# Patient Record
Sex: Female | Born: 1975 | Race: White | Hispanic: No | Marital: Married | Smoking: Never smoker
Health system: Southern US, Community
[De-identification: ages and names within clinical notes are randomized; demographics above are authoritative.]

## PROBLEM LIST (undated history)

## (undated) DIAGNOSIS — E039 Hypothyroidism, unspecified: Secondary | ICD-10-CM

## (undated) DIAGNOSIS — R51 Headache: Secondary | ICD-10-CM

## (undated) DIAGNOSIS — F40240 Claustrophobia: Secondary | ICD-10-CM

## (undated) DIAGNOSIS — F419 Anxiety disorder, unspecified: Secondary | ICD-10-CM

## (undated) DIAGNOSIS — E282 Polycystic ovarian syndrome: Secondary | ICD-10-CM

## (undated) HISTORY — PX: TONSILLECTOMY: SUR1361

## (undated) HISTORY — PX: HYSTEROSCOPY: SHX211

---

## 2007-05-20 ENCOUNTER — Ambulatory Visit (HOSPITAL_COMMUNITY): Admission: RE | Admit: 2007-05-20 | Discharge: 2007-05-20 | Payer: Self-pay | Admitting: Obstetrics and Gynecology

## 2007-05-20 ENCOUNTER — Encounter (INDEPENDENT_AMBULATORY_CARE_PROVIDER_SITE_OTHER): Payer: Self-pay | Admitting: Obstetrics and Gynecology

## 2009-11-08 ENCOUNTER — Ambulatory Visit (HOSPITAL_COMMUNITY): Admission: RE | Admit: 2009-11-08 | Discharge: 2009-11-08 | Payer: Self-pay | Admitting: Obstetrics and Gynecology

## 2010-03-26 LAB — CBC
HCT: 42.5 % (ref 36.0–46.0)
Hemoglobin: 14.2 g/dL (ref 12.0–15.0)
MCH: 28.9 pg (ref 26.0–34.0)
MCHC: 33.5 g/dL (ref 30.0–36.0)
MCV: 86.4 fL (ref 78.0–100.0)
Platelets: 258 K/uL (ref 150–400)
RBC: 4.92 MIL/uL (ref 3.87–5.11)
RDW: 14.5 % (ref 11.5–15.5)
WBC: 9.3 K/uL (ref 4.0–10.5)

## 2010-03-26 LAB — HCG, SERUM, QUALITATIVE: Preg, Serum: NEGATIVE

## 2010-05-27 NOTE — Op Note (Signed)
NAME:  Valerie Boone, Valerie Boone                 ACCOUNT NO.:  [id number]   MEDICAL RECORD NO.:  [id number]          PATIENT TYPE:  AMB   LOCATION:  SDC                           FACILITY:  [facility]   PHYSICIAN:  Juluis Mire, M.D.   DATE OF BIRTH:  01-Jul-1975   DATE OF PROCEDURE:  05/20/2007  DATE OF DISCHARGE:                               OPERATIVE REPORT   PREOPERATIVE DIAGNOSIS:  Endometrial polyp.   POSTOPERATIVE DIAGNOSIS:  Endometrial polyp.   PROCEDURE:  Paracervical block.  Cervical dilatation.  Hysteroscopy with  resection of polyp and endometrial curettings.   SURGEON:  Juluis Mire, MD.   ANESTHESIA:  General along with paracervical block.   ESTIMATED BLOOD LOSS:  Minimal.   PACKS AND DRAINS:  None.   INTRAOPERATIVE BLOOD PLACED:  None.   COMPLICATIONS:  None.   Indications were dictated in history and physical.   PROCEDURE:  The patient was taken to OR, placed in supine position.  After satisfactory level of general anesthesia was obtained, the patient  was placed in dorsal lithotomy position using the Allen stirrups.  Perineum and vagina were prepped out with Betadine.  The patient was  then draped in sterile field.  A specks was placed in vaginal vault.  Cervix was grasped with a single-tooth tenaculum.  A paracervical blocks  instituted using 1% Nesacaine.  Uterus sounded to approximately 8 cm.  Cervix serially dilated to a size 31 Pratt dilator.  Operative  hysteroscope was then introduced into the intrauterine cavity.  Intrauterine cavity was distended using sorbitol.  Visualization  revealed a polyp near the left tubal opening on the anterior wall.  This  was resected in total and sent for pathology.  No other endometrial  abnormalities were appreciated.  I did not do any further biopsies in  view of the fact she is trying to conceive but went ahead and did  endometrial curettings.  These were sent for pathology.  Total deficit  was 75 mL.  The patient had no active  light bleeding.  There was no  signs of perforation or complications.  A single-  tooth tenaculum and speculum were then removed.  The patient taken out  of dorsal position.  Once alert and extubated, transferred to recovery  room in good condition.  Sponge, instrument, and needle count was  correct by circulating nurse.      Juluis Mire, M.D.  Electronically Signed     JSM/MEDQ  D:  05/20/2007  T:  05/20/2007  Job:  [id number]

## 2010-05-27 NOTE — H&P (Signed)
NAME:  Valerie Boone, Valerie Boone NO.:  [id number]   MEDICAL RECORD NO.:  [id number]          PATIENT TYPE:  AMB   LOCATION:  SDC                           [facility]ACILITY:  [facility]   PHYSICIAN:  Juluis Mire, M.D.   DATE OF BIRTH:  Apr 21, 1975   DATE OF ADMISSION:  DATE OF DISCHARGE:                              HISTORY & PHYSICAL   HISTORY OF PRESENT ILLNESS:  The patient is a 35 year old nulligravida  female who presents for hysteroscopy.   RELATION TO PRESENT ADMISSION:  The patient has a longstanding history  of oligomenorrhea secondary anovulation.  We did a saline infusion  ultrasound that revealed an anterior wall polyp.  She presents now for  hysteroscopic resection of the polyp.  The nature of the polyps and  nature of hysteroscopy have been discussed.  It is of note she is under  evaluation by Dr. 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   ALLERGIES:  SULFA AND AMPICILLIN.   MEDICATIONS:  None.   PAST MEDICAL HISTORY:  1. Usual childhood diseases.  2. No significant sequelae.  3. Only previous surgeries are tonsillectomy.   FAMILY HISTORY:  There is a history of cervical cancer in a sister and  diabetes in the family.   SOCIAL HISTORY:  Reveals no tobacco or alcohol use.   REVIEW OF SYSTEMS:  Noncontributory.   PHYSICAL EXAMINATION:  VITAL SIGNS:  The patient is afebrile, stable  vital signs.  HEENT: The patient is normocephalic.  Pupils equal, round, reactive to  light and accommodation.  Extraocular movements were intact.  Sclerae  and conjunctive are clear.  Oropharynx clear.  BREASTS:  No discrete masses.  LUNGS:  Clear.  CARDIOVASCULAR:  System regular rate murmurs or gallops.  ABDOMEN:  Benign.  No mass, megaly or tenderness.  PELVIC:  Normal external genitalia.  Vaginal mucosa clear.  Cervix  unremarkable.  Uterus normal size, shape and contour.  Adnexa free of  masses or tenderness.  EXTREMITIES:  Trace  edema.  NEUROLOGICAL:  Exam is grossly within normal limits.   IMPRESSION:  1. Anovulatory cycling  2. Endometrial polyp.   PLAN:  At the present time, the patient will undergo hysteroscopic  evaluation and resection of polyp.  The risk of procedure have been  discussed including the risk of infection.  Risk of hemorrhage could  require transfusion with the risk of AIDS or hepatitis.  Risk of injury  to adjacent organs through perforation that could require further  exploratory surgery.  Risk of deep venous thrombosis and pulmonary  embolus.  Certain complications could lead to hysterectomy.  Obviously,  leaving the patient infertile and sterile at that point in time.  The  patient versed understanding of indications and risks.      Juluis Mire, M.D.  Electronically Signed     JSM/MEDQ  D:  05/20/2007  T:  05/20/2007  Job:  [id number]

## 2010-07-28 ENCOUNTER — Other Ambulatory Visit: Payer: Self-pay | Admitting: Family Medicine

## 2010-07-28 ENCOUNTER — Other Ambulatory Visit (HOSPITAL_COMMUNITY)
Admission: RE | Admit: 2010-07-28 | Discharge: 2010-07-28 | Disposition: A | Discharge status: Home / Self Care | Payer: 59 | Source: Ambulatory Visit | Attending: Family Medicine | Admitting: Family Medicine

## 2010-07-28 DIAGNOSIS — Z124 Encounter for screening for malignant neoplasm of cervix: Secondary | ICD-10-CM | POA: Insufficient documentation

## 2010-07-28 DIAGNOSIS — Z1159 Encounter for screening for other viral diseases: Secondary | ICD-10-CM | POA: Insufficient documentation

## 2011-03-06 ENCOUNTER — Other Ambulatory Visit: Payer: Self-pay | Admitting: Family Medicine

## 2011-03-06 ENCOUNTER — Ambulatory Visit
Admission: RE | Admit: 2011-03-06 | Discharge: 2011-03-06 | Disposition: A | Discharge status: Home / Self Care | Payer: 59 | Source: Ambulatory Visit | Attending: Family Medicine | Admitting: Family Medicine

## 2011-03-06 DIAGNOSIS — M533 Sacrococcygeal disorders, not elsewhere classified: Secondary | ICD-10-CM

## 2011-05-19 ENCOUNTER — Ambulatory Visit: Payer: 59 | Attending: Family Medicine

## 2011-05-19 DIAGNOSIS — R5381 Other malaise: Secondary | ICD-10-CM | POA: Insufficient documentation

## 2011-05-19 DIAGNOSIS — M25559 Pain in unspecified hip: Secondary | ICD-10-CM | POA: Insufficient documentation

## 2011-05-19 DIAGNOSIS — IMO0001 Reserved for inherently not codable concepts without codable children: Secondary | ICD-10-CM | POA: Insufficient documentation

## 2011-05-28 ENCOUNTER — Encounter: Payer: 59 | Admitting: Physical Therapy

## 2011-06-02 ENCOUNTER — Ambulatory Visit: Payer: 59

## 2011-06-11 ENCOUNTER — Ambulatory Visit: Payer: 59 | Admitting: Physical Therapy

## 2011-06-18 ENCOUNTER — Other Ambulatory Visit (HOSPITAL_COMMUNITY)
Admission: RE | Admit: 2011-06-18 | Discharge: 2011-06-18 | Disposition: A | Discharge status: Home / Self Care | Payer: 59 | Source: Ambulatory Visit | Attending: Obstetrics and Gynecology | Admitting: Obstetrics and Gynecology

## 2011-06-18 ENCOUNTER — Other Ambulatory Visit: Payer: Self-pay | Admitting: Obstetrics and Gynecology

## 2011-06-18 DIAGNOSIS — Z1159 Encounter for screening for other viral diseases: Secondary | ICD-10-CM | POA: Insufficient documentation

## 2011-06-18 DIAGNOSIS — Z01419 Encounter for gynecological examination (general) (routine) without abnormal findings: Secondary | ICD-10-CM | POA: Insufficient documentation

## 2011-09-24 ENCOUNTER — Ambulatory Visit: Payer: 59 | Attending: Family Medicine | Admitting: Physical Therapy

## 2011-09-24 DIAGNOSIS — M2569 Stiffness of other specified joint, not elsewhere classified: Secondary | ICD-10-CM | POA: Insufficient documentation

## 2011-09-24 DIAGNOSIS — M542 Cervicalgia: Secondary | ICD-10-CM | POA: Insufficient documentation

## 2011-09-24 DIAGNOSIS — IMO0001 Reserved for inherently not codable concepts without codable children: Secondary | ICD-10-CM | POA: Insufficient documentation

## 2011-10-01 ENCOUNTER — Ambulatory Visit: Payer: 59

## 2011-10-08 ENCOUNTER — Ambulatory Visit: Payer: 59 | Admitting: Physical Therapy

## 2011-10-13 ENCOUNTER — Ambulatory Visit: Payer: 59 | Attending: Family Medicine | Admitting: Physical Therapy

## 2011-10-13 DIAGNOSIS — M542 Cervicalgia: Secondary | ICD-10-CM | POA: Insufficient documentation

## 2011-10-13 DIAGNOSIS — IMO0001 Reserved for inherently not codable concepts without codable children: Secondary | ICD-10-CM | POA: Insufficient documentation

## 2011-10-13 DIAGNOSIS — M2569 Stiffness of other specified joint, not elsewhere classified: Secondary | ICD-10-CM | POA: Insufficient documentation

## 2011-10-15 ENCOUNTER — Ambulatory Visit: Payer: 59 | Admitting: Physical Therapy

## 2011-10-20 ENCOUNTER — Ambulatory Visit: Payer: 59 | Admitting: Physical Therapy

## 2011-10-22 ENCOUNTER — Ambulatory Visit: Payer: 59 | Admitting: Physical Therapy

## 2011-11-12 ENCOUNTER — Ambulatory Visit: Payer: 59

## 2011-11-17 ENCOUNTER — Ambulatory Visit: Payer: 59 | Attending: Family Medicine | Admitting: Physical Therapy

## 2011-11-17 DIAGNOSIS — M542 Cervicalgia: Secondary | ICD-10-CM | POA: Insufficient documentation

## 2011-11-17 DIAGNOSIS — IMO0001 Reserved for inherently not codable concepts without codable children: Secondary | ICD-10-CM | POA: Insufficient documentation

## 2011-11-17 DIAGNOSIS — M2569 Stiffness of other specified joint, not elsewhere classified: Secondary | ICD-10-CM | POA: Insufficient documentation

## 2011-12-01 ENCOUNTER — Ambulatory Visit: Payer: 59 | Admitting: Physical Therapy

## 2011-12-25 ENCOUNTER — Other Ambulatory Visit: Payer: Self-pay | Admitting: Family Medicine

## 2011-12-25 DIAGNOSIS — M542 Cervicalgia: Secondary | ICD-10-CM

## 2011-12-31 ENCOUNTER — Ambulatory Visit
Admission: RE | Admit: 2011-12-31 | Discharge: 2011-12-31 | Disposition: A | Discharge status: Home / Self Care | Payer: 59 | Source: Ambulatory Visit | Attending: Family Medicine | Admitting: Family Medicine

## 2011-12-31 DIAGNOSIS — M542 Cervicalgia: Secondary | ICD-10-CM

## 2012-01-01 ENCOUNTER — Other Ambulatory Visit: Payer: 59

## 2012-01-20 ENCOUNTER — Other Ambulatory Visit: Payer: Self-pay | Admitting: Neurosurgery

## 2012-01-28 ENCOUNTER — Encounter (HOSPITAL_COMMUNITY)
Admission: RE | Admit: 2012-01-28 | Discharge: 2012-01-28 | Disposition: A | Discharge status: Home / Self Care | Payer: 59 | Source: Ambulatory Visit | Attending: Neurosurgery | Admitting: Neurosurgery

## 2012-01-28 ENCOUNTER — Encounter (HOSPITAL_COMMUNITY): Payer: Self-pay

## 2012-01-28 HISTORY — DX: Hypothyroidism, unspecified: E03.9

## 2012-01-28 HISTORY — DX: Anxiety disorder, unspecified: F41.9

## 2012-01-28 HISTORY — DX: Claustrophobia: F40.240

## 2012-01-28 HISTORY — DX: Polycystic ovarian syndrome: E28.2

## 2012-01-28 HISTORY — DX: Headache: R51

## 2012-01-28 LAB — SURGICAL PCR SCREEN: MRSA, PCR: NEGATIVE

## 2012-01-28 LAB — CBC WITH DIFFERENTIAL/PLATELET
Basophils Relative: 0 % (ref 0–1)
Eosinophils Absolute: 0.1 10*3/uL (ref 0.0–0.7)
MCH: 27.9 pg (ref 26.0–34.0)
MCHC: 33.2 g/dL (ref 30.0–36.0)
Neutrophils Relative %: 68 % (ref 43–77)
Platelets: 273 10*3/uL (ref 150–400)
RDW: 14.8 % (ref 11.5–15.5)

## 2012-01-28 LAB — HCG, SERUM, QUALITATIVE: Preg, Serum: NEGATIVE

## 2012-01-28 LAB — TYPE AND SCREEN
ABO/RH(D): A POS
Antibody Screen: NEGATIVE

## 2012-01-28 NOTE — Pre-Procedure Instructions (Signed)
Valerie Boone  01/28/2012   Your procedure is scheduled on:  Tuesday, January 21st  Report to [facility] at 0530 AM.  Call this number if you have problems the morning of surgery: [phone]   Remember:   Do not eat food or drink liquids after midnight.    Take these medicines the morning of surgery with A SIP OF WATER:    Do not wear jewelry, make-up or nail polish.  Do not wear lotions, powders, or perfumes. You may not wear deodorant.  Do not shave 48 hours prior to surgery. Men may shave face and neck.  Do not bring valuables to the hospital.  Contacts, dentures or bridgework may not be worn into surgery.  Leave suitcase in the car. After surgery it may be brought to your room.  For patients admitted to the hospital, checkout time is 11:00 AM the day of  discharge.   Patients discharged the day of surgery will not be allowed to drive  home.   Special Instructions: Shower using CHG 2 nights before surgery and the night before surgery.  If you shower the day of surgery use CHG.  Use special wash - you have one bottle of CHG for all showers.  You should use approximately 1/3 of the bottle for each shower.   Please read over the following fact sheets that you were given: Pain Booklet, Coughing and Deep Breathing, Blood Transfusion Information, MRSA Information and Surgical Site Infection Prevention

## 2012-01-28 NOTE — Progress Notes (Addendum)
Primary Physician - Dr. Carilyn Goodpasture, [facility] physicians  Does not have a cardiologist --- no previous cardiac testing.  Patient is on metformin for polycystic ovary disease not diabetes.

## 2012-02-02 ENCOUNTER — Ambulatory Visit (HOSPITAL_COMMUNITY): Payer: 59 | Admitting: Anesthesiology

## 2012-02-02 ENCOUNTER — Inpatient Hospital Stay (HOSPITAL_COMMUNITY)
Admission: RE | Admit: 2012-02-02 | Discharge: 2012-02-03 | DRG: 472 | Disposition: A | Discharge status: Home / Self Care | Payer: 59 | Source: Ambulatory Visit | Attending: Neurosurgery | Admitting: Neurosurgery

## 2012-02-02 ENCOUNTER — Ambulatory Visit (HOSPITAL_COMMUNITY): Payer: 59

## 2012-02-02 ENCOUNTER — Encounter (HOSPITAL_COMMUNITY)
Admission: RE | Disposition: A | Discharge status: Home / Self Care | Payer: Self-pay | Source: Ambulatory Visit | Attending: Neurosurgery

## 2012-02-02 ENCOUNTER — Encounter (HOSPITAL_COMMUNITY): Payer: Self-pay | Admitting: Anesthesiology

## 2012-02-02 ENCOUNTER — Encounter (HOSPITAL_COMMUNITY): Payer: Self-pay | Admitting: *Deleted

## 2012-02-02 ENCOUNTER — Encounter (HOSPITAL_COMMUNITY): Payer: Self-pay | Admitting: Neurosurgery

## 2012-02-02 DIAGNOSIS — Z79899 Other long term (current) drug therapy: Secondary | ICD-10-CM

## 2012-02-02 DIAGNOSIS — E039 Hypothyroidism, unspecified: Secondary | ICD-10-CM | POA: Diagnosis present

## 2012-02-02 DIAGNOSIS — Z9089 Acquired absence of other organs: Secondary | ICD-10-CM

## 2012-02-02 DIAGNOSIS — F40298 Other specified phobia: Secondary | ICD-10-CM | POA: Diagnosis present

## 2012-02-02 DIAGNOSIS — Z882 Allergy status to sulfonamides status: Secondary | ICD-10-CM

## 2012-02-02 DIAGNOSIS — Z881 Allergy status to other antibiotic agents status: Secondary | ICD-10-CM

## 2012-02-02 DIAGNOSIS — E282 Polycystic ovarian syndrome: Secondary | ICD-10-CM | POA: Diagnosis present

## 2012-02-02 DIAGNOSIS — M5000 Cervical disc disorder with myelopathy, unspecified cervical region: Principal | ICD-10-CM | POA: Diagnosis present

## 2012-02-02 DIAGNOSIS — E669 Obesity, unspecified: Secondary | ICD-10-CM | POA: Diagnosis present

## 2012-02-02 DIAGNOSIS — M5010 Cervical disc disorder with radiculopathy, unspecified cervical region: Secondary | ICD-10-CM | POA: Diagnosis present

## 2012-02-02 DIAGNOSIS — M4712 Other spondylosis with myelopathy, cervical region: Secondary | ICD-10-CM | POA: Diagnosis present

## 2012-02-02 DIAGNOSIS — F411 Generalized anxiety disorder: Secondary | ICD-10-CM | POA: Diagnosis present

## 2012-02-02 HISTORY — PX: ANTERIOR CERVICAL DECOMP/DISCECTOMY FUSION: SHX1161

## 2012-02-02 LAB — CREATININE, SERUM: GFR calc Af Amer: >90 mL/min (ref >90)

## 2012-02-02 SURGERY — ANTERIOR CERVICAL DECOMPRESSION/DISCECTOMY FUSION 2 LEVELS
Anesthesia: General | Site: Neck | Wound class: Clean

## 2012-02-02 MED ORDER — MENTHOL 3 MG MT LOZG: 1.0000 | LOZENGE | OROMUCOSAL | Status: DC | PRN

## 2012-02-02 MED ORDER — SODIUM CHLORIDE 0.9 % IV SOLN
INTRAVENOUS | Status: AC
  Filled 2012-02-02: qty 500

## 2012-02-02 MED ORDER — ZOLPIDEM TARTRATE 5 MG PO TABS: 5.0000 mg | ORAL_TABLET | Freq: Every evening | ORAL | Status: DC | PRN

## 2012-02-02 MED ORDER — DEXAMETHASONE SODIUM PHOSPHATE 10 MG/ML IJ SOLN: 10.0000 mg | INTRAMUSCULAR | Status: DC

## 2012-02-02 MED ORDER — HYDROCODONE-ACETAMINOPHEN 5-325 MG PO TABS: 1.0000 | ORAL_TABLET | ORAL | Status: DC | PRN

## 2012-02-02 MED ORDER — THROMBIN 5000 UNITS EX KIT
PACK | CUTANEOUS | Status: DC | PRN
  Administered 2012-02-02 (×2): 5000 [IU] via TOPICAL

## 2012-02-02 MED ORDER — GLYCOPYRROLATE 0.2 MG/ML IJ SOLN
INTRAMUSCULAR | Status: DC | PRN
  Administered 2012-02-02: 0.4 mg via INTRAVENOUS

## 2012-02-02 MED ORDER — SENNA 8.6 MG PO TABS
1.0000 | ORAL_TABLET | Freq: Two times a day (BID) | ORAL | Status: DC
  Administered 2012-02-02: 8.6 mg via ORAL
  Filled 2012-02-02 (×3): qty 1

## 2012-02-02 MED ORDER — BACITRACIN 50000 UNITS IM SOLR
INTRAMUSCULAR | Status: AC
  Filled 2012-02-02: qty 1

## 2012-02-02 MED ORDER — LIDOCAINE HCL (CARDIAC) 20 MG/ML IV SOLN
INTRAVENOUS | Status: DC | PRN
  Administered 2012-02-02: 40 mg via INTRAVENOUS

## 2012-02-02 MED ORDER — ACETAMINOPHEN 650 MG RE SUPP: 650.0000 mg | RECTAL | Status: DC | PRN

## 2012-02-02 MED ORDER — ONDANSETRON HCL 4 MG/2ML IJ SOLN: 4.0000 mg | INTRAMUSCULAR | Status: DC | PRN

## 2012-02-02 MED ORDER — LACTATED RINGERS IV SOLN
INTRAVENOUS | Status: DC | PRN
  Administered 2012-02-02: 07:00:00 via INTRAVENOUS

## 2012-02-02 MED ORDER — HYDROMORPHONE HCL PF 1 MG/ML IJ SOLN
0.2500 mg | INTRAMUSCULAR | Status: DC | PRN
  Administered 2012-02-02 (×2): 0.5 mg via INTRAVENOUS

## 2012-02-02 MED ORDER — CYCLOBENZAPRINE HCL 10 MG PO TABS
10.0000 mg | ORAL_TABLET | Freq: Three times a day (TID) | ORAL | Status: DC | PRN
  Administered 2012-02-02 – 2012-02-03 (×2): 10 mg via ORAL
  Filled 2012-02-02 (×2): qty 1

## 2012-02-02 MED ORDER — ALUM & MAG HYDROXIDE-SIMETH 200-200-20 MG/5ML PO SUSP: 30.0000 mL | Freq: Four times a day (QID) | ORAL | Status: DC | PRN

## 2012-02-02 MED ORDER — HEMOSTATIC AGENTS (NO CHARGE) OPTIME
TOPICAL | Status: DC | PRN
  Administered 2012-02-02: 1 via TOPICAL

## 2012-02-02 MED ORDER — SODIUM CHLORIDE 0.9 % IR SOLN
Status: DC | PRN
  Administered 2012-02-02: 09:00:00

## 2012-02-02 MED ORDER — LEVOTHYROXINE SODIUM 25 MCG PO TABS
25.0000 ug | ORAL_TABLET | Freq: Every day | ORAL | Status: DC
  Administered 2012-02-03: 25 ug via ORAL
  Filled 2012-02-02 (×2): qty 1

## 2012-02-02 MED ORDER — HYDROMORPHONE HCL PF 1 MG/ML IJ SOLN: 0.5000 mg | INTRAMUSCULAR | Status: DC | PRN

## 2012-02-02 MED ORDER — ONDANSETRON HCL 4 MG/2ML IJ SOLN
INTRAMUSCULAR | Status: DC | PRN
  Administered 2012-02-02: 4 mg via INTRAVENOUS

## 2012-02-02 MED ORDER — FLEET ENEMA 7-19 GM/118ML RE ENEM: 1.0000 | ENEMA | Freq: Once | RECTAL | Status: AC | PRN

## 2012-02-02 MED ORDER — PROPOFOL 10 MG/ML IV BOLUS
INTRAVENOUS | Status: DC | PRN
  Administered 2012-02-02: 160 mg via INTRAVENOUS

## 2012-02-02 MED ORDER — PHENOL 1.4 % MT LIQD: 1.0000 | OROMUCOSAL | Status: DC | PRN

## 2012-02-02 MED ORDER — NEOSTIGMINE METHYLSULFATE 1 MG/ML IJ SOLN
INTRAMUSCULAR | Status: DC | PRN
  Administered 2012-02-02: 3 mg via INTRAVENOUS

## 2012-02-02 MED ORDER — THROMBIN 5000 UNITS EX SOLR
OROMUCOSAL | Status: DC | PRN
  Administered 2012-02-02: 09:00:00 via TOPICAL

## 2012-02-02 MED ORDER — METFORMIN HCL 500 MG PO TABS
500.0000 mg | ORAL_TABLET | Freq: Three times a day (TID) | ORAL | Status: DC
  Administered 2012-02-02 – 2012-02-03 (×3): 500 mg via ORAL
  Filled 2012-02-02 (×6): qty 1

## 2012-02-02 MED ORDER — BISACODYL 10 MG RE SUPP: 10.0000 mg | Freq: Every day | RECTAL | Status: DC | PRN

## 2012-02-02 MED ORDER — SODIUM CHLORIDE 0.9 % IJ SOLN: 3.0000 mL | INTRAMUSCULAR | Status: DC | PRN

## 2012-02-02 MED ORDER — ADULT MULTIVITAMIN W/MINERALS CH
1.0000 | ORAL_TABLET | Freq: Every day | ORAL | Status: DC
  Administered 2012-02-02: 1 via ORAL
  Filled 2012-02-02 (×2): qty 1

## 2012-02-02 MED ORDER — SODIUM CHLORIDE 0.9 % IJ SOLN
3.0000 mL | Freq: Two times a day (BID) | INTRAMUSCULAR | Status: DC
  Administered 2012-02-02 (×2): 3 mL via INTRAVENOUS

## 2012-02-02 MED ORDER — MIDAZOLAM HCL 5 MG/5ML IJ SOLN
INTRAMUSCULAR | Status: DC | PRN
  Administered 2012-02-02: 2 mg via INTRAVENOUS

## 2012-02-02 MED ORDER — ONDANSETRON HCL 4 MG/2ML IJ SOLN: 4.0000 mg | Freq: Once | INTRAMUSCULAR | Status: DC | PRN

## 2012-02-02 MED ORDER — SODIUM CHLORIDE 0.9 % IV SOLN
1500.0000 mg | Freq: Once | INTRAVENOUS | Status: AC
  Administered 2012-02-02: 1500 mg via INTRAVENOUS
  Filled 2012-02-02: qty 1500

## 2012-02-02 MED ORDER — FENTANYL CITRATE 0.05 MG/ML IJ SOLN
INTRAMUSCULAR | Status: DC | PRN
Start: 2012-02-02 — End: 2012-02-02
  Administered 2012-02-02: 50 ug via INTRAVENOUS
  Administered 2012-02-02: 150 ug via INTRAVENOUS
  Administered 2012-02-02: 50 ug via INTRAVENOUS
  Administered 2012-02-02: 100 ug via INTRAVENOUS
  Administered 2012-02-02: 50 ug via INTRAVENOUS

## 2012-02-02 MED ORDER — ACETAMINOPHEN 325 MG PO TABS: 650.0000 mg | ORAL_TABLET | ORAL | Status: DC | PRN

## 2012-02-02 MED ORDER — DEXAMETHASONE SODIUM PHOSPHATE 4 MG/ML IJ SOLN
INTRAMUSCULAR | Status: DC | PRN
  Administered 2012-02-02: 12 mg via INTRAVENOUS

## 2012-02-02 MED ORDER — HYDROMORPHONE HCL PF 1 MG/ML IJ SOLN
INTRAMUSCULAR | Status: AC
  Filled 2012-02-02: qty 1

## 2012-02-02 MED ORDER — 0.9 % SODIUM CHLORIDE (POUR BTL) OPTIME
TOPICAL | Status: DC | PRN
  Administered 2012-02-02: 1000 mL

## 2012-02-02 MED ORDER — ROCURONIUM BROMIDE 100 MG/10ML IV SOLN
INTRAVENOUS | Status: DC | PRN
  Administered 2012-02-02: 50 mg via INTRAVENOUS

## 2012-02-02 MED ORDER — OXYCODONE-ACETAMINOPHEN 5-325 MG PO TABS
1.0000 | ORAL_TABLET | ORAL | Status: DC | PRN
  Administered 2012-02-02 – 2012-02-03 (×4): 2 via ORAL
  Filled 2012-02-02 (×4): qty 2

## 2012-02-02 MED ORDER — ACETAMINOPHEN 10 MG/ML IV SOLN: 1000.0000 mg | Freq: Once | INTRAVENOUS | Status: DC | PRN

## 2012-02-02 MED ORDER — VANCOMYCIN HCL 10 G IV SOLR
1500.0000 mg | Freq: Two times a day (BID) | INTRAVENOUS | Status: DC
  Administered 2012-02-02 – 2012-02-03 (×2): 1500 mg via INTRAVENOUS
  Filled 2012-02-02 (×3): qty 1500

## 2012-02-02 MED ORDER — ACETAMINOPHEN 10 MG/ML IV SOLN
INTRAVENOUS | Status: AC
  Administered 2012-02-02: 1000 mg via INTRAVENOUS
  Filled 2012-02-02: qty 100

## 2012-02-02 MED ORDER — CYCLOBENZAPRINE HCL 10 MG PO TABS: 10.0000 mg | ORAL_TABLET | Freq: Three times a day (TID) | ORAL | Status: DC | PRN

## 2012-02-02 MED ORDER — POLYETHYLENE GLYCOL 3350 17 G PO PACK
17.0000 g | PACK | Freq: Every day | ORAL | Status: DC | PRN
  Filled 2012-02-02: qty 1

## 2012-02-02 SURGICAL SUPPLY — 56 items
BAG DECANTER FOR FLEXI CONT (MISCELLANEOUS) ×2 IMPLANT
BENZOIN TINCTURE PRP APPL 2/3 (GAUZE/BANDAGES/DRESSINGS) ×2 IMPLANT
BRUSH SCRUB EZ PLAIN DRY (MISCELLANEOUS) ×2 IMPLANT
BUR MATCHSTICK NEURO 3.0 LAGG (BURR) ×2 IMPLANT
CANISTER SUCTION 2500CC (MISCELLANEOUS) ×2 IMPLANT
CLOTH BEACON ORANGE TIMEOUT ST (SAFETY) ×2 IMPLANT
CONT SPEC 4OZ CLIKSEAL STRL BL (MISCELLANEOUS) ×2 IMPLANT
DRAPE C-ARM 42X72 X-RAY (DRAPES) ×4 IMPLANT
DRAPE LAPAROTOMY 100X72 PEDS (DRAPES) ×2 IMPLANT
DRAPE MICROSCOPE LEICA (MISCELLANEOUS) ×2 IMPLANT
DRAPE MICROSCOPE ZEISS OPMI (DRAPES) IMPLANT
DRAPE POUCH INSTRU U-SHP 10X18 (DRAPES) ×2 IMPLANT
DRILL BIT (BIT) ×2 IMPLANT
ELECT COATED BLADE 2.86 ST (ELECTRODE) ×2 IMPLANT
ELECT REM PT RETURN 9FT ADLT (ELECTROSURGICAL) ×2
ELECTRODE REM PT RTRN 9FT ADLT (ELECTROSURGICAL) ×1 IMPLANT
EVACUATOR 1/8 PVC DRAIN (DRAIN) ×2 IMPLANT
GAUZE SPONGE 4X4 16PLY XRAY LF (GAUZE/BANDAGES/DRESSINGS) IMPLANT
GLOVE BIOGEL M 8.0 STRL (GLOVE) ×2 IMPLANT
GLOVE BIOGEL PI IND STRL 6.5 (GLOVE) ×3 IMPLANT
GLOVE BIOGEL PI INDICATOR 6.5 (GLOVE) ×3
GLOVE ECLIPSE 8.5 STRL (GLOVE) ×2 IMPLANT
GLOVE EXAM NITRILE LRG STRL (GLOVE) IMPLANT
GLOVE EXAM NITRILE MD LF STRL (GLOVE) IMPLANT
GLOVE EXAM NITRILE XL STR (GLOVE) IMPLANT
GLOVE EXAM NITRILE XS STR PU (GLOVE) IMPLANT
GLOVE INDICATOR 7.0 STRL GRN (GLOVE) ×4 IMPLANT
GOWN BRE IMP SLV AUR LG STRL (GOWN DISPOSABLE) ×2 IMPLANT
GOWN BRE IMP SLV AUR XL STRL (GOWN DISPOSABLE) ×4 IMPLANT
GOWN STRL REIN 2XL LVL4 (GOWN DISPOSABLE) IMPLANT
HEAD HALTER (SOFTGOODS) ×2 IMPLANT
HEMOSTAT POWDER SURGIFOAM 1G (HEMOSTASIS) ×2 IMPLANT
HEMOSTAT SURGICEL 2X14 (HEMOSTASIS) IMPLANT
KIT BASIN OR (CUSTOM PROCEDURE TRAY) ×2 IMPLANT
KIT ROOM TURNOVER OR (KITS) ×2 IMPLANT
NEEDLE SPNL 20GX3.5 QUINCKE YW (NEEDLE) ×2 IMPLANT
NS IRRIG 1000ML POUR BTL (IV SOLUTION) ×2 IMPLANT
PACK LAMINECTOMY NEURO (CUSTOM PROCEDURE TRAY) ×2 IMPLANT
PAD ARMBOARD 7.5X6 YLW CONV (MISCELLANEOUS) ×6 IMPLANT
PLATE VISION ELITE 40MM (Plate) ×2 IMPLANT
RUBBERBAND STERILE (MISCELLANEOUS) ×4 IMPLANT
SCREW ST 13X4XST VA NS SPNE (Screw) ×6 IMPLANT
SCREW ST VAR 4 ATL (Screw) ×6 IMPLANT
SPACER BONE CORNERSTONE 6X14 (Orthopedic Implant) ×4 IMPLANT
SPONGE GAUZE 4X4 12PLY (GAUZE/BANDAGES/DRESSINGS) IMPLANT
SPONGE INTESTINAL PEANUT (DISPOSABLE) ×2 IMPLANT
SPONGE SURGIFOAM ABS GEL SZ50 (HEMOSTASIS) ×2 IMPLANT
STRIP CLOSURE SKIN 1/2X4 (GAUZE/BANDAGES/DRESSINGS) ×2 IMPLANT
SUT PDS AB 5-0 P3 18 (SUTURE) ×2 IMPLANT
SUT VIC AB 3-0 SH 8-18 (SUTURE) ×2 IMPLANT
SYR 20ML ECCENTRIC (SYRINGE) ×2 IMPLANT
TAPE CLOTH 4X10 WHT NS (GAUZE/BANDAGES/DRESSINGS) ×2 IMPLANT
TAPE CLOTH SURG 4X10 WHT LF (GAUZE/BANDAGES/DRESSINGS) ×2 IMPLANT
TOWEL OR 17X24 6PK STRL BLUE (TOWEL DISPOSABLE) ×2 IMPLANT
TOWEL OR 17X26 10 PK STRL BLUE (TOWEL DISPOSABLE) ×2 IMPLANT
WATER STERILE IRR 1000ML POUR (IV SOLUTION) ×2 IMPLANT

## 2012-02-02 NOTE — Progress Notes (Signed)
Called Dr. Dutch Quint regarding allergy to ampicillin. No answer.

## 2012-02-02 NOTE — Transfer of Care (Signed)
Immediate Anesthesia Transfer of Care Note  Patient: Valerie Boone  Procedure(s) Performed: Procedure(s) (LRB) with comments: ANTERIOR CERVICAL DECOMPRESSION/DISCECTOMY FUSION 2 LEVELS (N/A) - Cerivcal five-six, cervical six-seven anterior cervical decompression fusion with allograft and plating  Patient Location: PACU  Anesthesia Type:General  Level of Consciousness: awake, alert , oriented and patient cooperative  Airway & Oxygen Therapy: Patient Spontanous Breathing and Patient connected to nasal cannula oxygen  Post-op Assessment: Report given to PACU RN, Post -op Vital signs reviewed and stable and Patient moving all extremities  Post vital signs: Reviewed and stable  Complications: No apparent anesthesia complications

## 2012-02-02 NOTE — Plan of Care (Signed)
Problem: Consults Goal: Diagnosis - Spinal Surgery Outcome: Completed/Met Date Met:  02/02/12 Cervical Spine Fusion

## 2012-02-02 NOTE — H&P (Signed)
Valerie Boone is an 37 y.o. female.   Chief Complaint: Neck and left arm pain HPI: 37 year old female with neck and left upper extremity pain failing conservative management and presents with signs and symptoms of a C6 and C7 radiculopathy. Patient has undergone MRI testing which demonstrates thickening spondylosis with nerve root compression at C5-6 and C6-7. Patient presents now for 2 level anterior cervical decompression and fusion.  Past Medical History  Diagnosis Date  . Hypothyroidism   . Anxiety   . Claustrophobia   . Polycystic ovary disease   . Headache     tension - 3 to 4 times per week    Past Surgical History  Procedure Date  . Tonsillectomy   . Hysteroscopy     History reviewed. No pertinent family history. Social History:  reports that she has never smoked. She does not have any smokeless tobacco history on file. She reports that she drinks alcohol. She reports that she does not use illicit drugs.  Allergies:  Allergies  Allergen Reactions  . Ampicillin Hives    High fevers  . Sulfa Antibiotics Hives    High fever    Medications Prior to Admission  Medication Sig Dispense Refill  . levothyroxine (SYNTHROID, LEVOTHROID) 25 MCG tablet Take 25 mcg by mouth daily.      . metFORMIN (GLUCOPHAGE) 500 MG tablet Take 500 mg by mouth 3 (three) times daily with meals.      . Multiple Vitamin (MULTIVITAMIN WITH MINERALS) TABS Take 1 tablet by mouth daily.        No results found for this or any previous visit (from the past 48 hour(s)). No results found.  Review of Systems  Constitutional: Negative.   HENT: Negative.   Eyes: Negative.   Respiratory: Negative.   Cardiovascular: Negative.   Gastrointestinal: Negative.   Genitourinary: Negative.   Musculoskeletal: Negative.   Skin: Negative.   Neurological: Negative.   Endo/Heme/Allergies: Negative.   Psychiatric/Behavioral: Negative.     Blood pressure 137/90, pulse 70, temperature 97.7 F (36.5 C),  temperature source Oral, resp. rate 18, SpO2 96.00%. Physical Exam  Constitutional: She is oriented to person, place, and time. She appears well-developed and well-nourished. No distress.  HENT:  Head: Normocephalic and atraumatic.  Right Ear: External ear normal.  Left Ear: External ear normal.  Nose: Nose normal.  Mouth/Throat: Oropharynx is clear and moist.  Eyes: Conjunctivae normal and EOM are normal. Pupils are equal, round, and reactive to light. Right eye exhibits no discharge. Left eye exhibits no discharge.  Neck: Normal range of motion. Neck supple. No tracheal deviation present. No thyromegaly present.  Cardiovascular: Normal rate, regular rhythm, normal heart sounds and intact distal pulses.  Exam reveals no friction rub.   No murmur heard. Respiratory: Effort normal and breath sounds normal. No respiratory distress. She has no wheezes.  GI: Soft. Bowel sounds are normal. She exhibits no distension. There is no tenderness.  Musculoskeletal: Normal range of motion. She exhibits no edema and no tenderness.  Neurological: She is alert and oriented to person, place, and time. She has normal reflexes. No cranial nerve deficit. Coordination normal.  Skin: Skin is warm and dry. No rash noted. She is not diaphoretic. No erythema. No pallor.  Psychiatric: She has a normal mood and affect. Her behavior is normal. Judgment and thought content normal.     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 Plan C5-6 and C6-7 anterior cervical setting fusion with allograft and her plating. Risks and  benefits been explained. Patient wishes to proceed.  Corie Vavra A 02/02/2012, 7:46 AM

## 2012-02-02 NOTE — Op Note (Signed)
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  Postoperative diagnosis: Same  Procedure Name: C5-6, C6-7 anterior cervical discectomy and fusion with allograft and anterior plating.  Surgeon:Jannatul Wojdyla A.Laquincy Eastridge, M.D.  Asst. Surgeon: Jeral Fruit  Anesthesia: General  Indication: 37 year old female with neck and left upper trimming pain failing conservative management her workup demonstrates evidence of central disc herniation at C5-6 with spinal cord compression and spondylosis off the left-sided C6-7 with neural foraminal stenosis. Patient's failed conservative management and presents now for 2 level anterior cervical decompression infusion in hopes of improving her symptoms.  Operative note: After induction anesthesia, patient positioned supine with her neck slightly extended. Cervical region prepped and draped. Incision made over the C6 level. Dissection proceeded through the platysma and along the medial border of the sternocleidomastoid muscle and carotid sheath. Trachea and esophagus were mobilized and retracted towards the left. Prevertebral fascia stripped off the anterior spinal column. Longus colli muscles elevated bilaterally. Self-retaining retractors placed intraoperative fluoroscopy used levels confirmed. The spaces at C5-6 and C6-7 incised with 15 blade in a rectangular fashion. Y. disc space cleanout was achieved using pituitary rongeurs forward and angled and backward angle Carlen curettes Kerrison rongeurs the high-speed drill. All elements the disc removed down to level posterior annulus. Microscope was then brought into the field and used throughout the remainder of the discectomy. Remaining aspects of annulus and osteophytes removed using high-speed drill down to level posterior lateral. Posterior longitudinal limb was elevated and  resected piecemeal fashion using Kerrison rongeurs. Underlying thecal sac was identified. Wide central decompression and perform undercutting the bodies of C5 and C6. Decompression MCH are of foramen. Wide anterior foraminotomies were then performed on course exiting C6 nerve roots bilaterally. At this point a very thorough decompression had been achieved. There is no his injury to thecal sac and nerve roots. Procedure then repeated at C6-7 again without complication. Wound is then irrigated out solution. Gelfoam was placed topically for hemostasis as needed. Cornerstone allograft wedges and packed in place recessed roughly 1 mm from the anterior cortical margin. Atlantis anterior cervical plate was then placed over the C5-6 and 7 levels. This is an attachment under fluoroscopic guidance. 13 mm variable screws were placed at all 3 levels. All screws given a final tightening be solidly within bone. Locking screws were engaged. Final images were limited secondary to patient's body habitus but appear to show good position of plate at the proper upper level with normal lamina spine. Wound is then irrigated one final time. Medium Hemovac drain was left in the prevertebral space. Wounds and close in layers with Vicryl sutures. Steri-Strips and a sterile dressing were applied. There were no apparent complications. Patient tolerated the procedure well and returned to the recovery postop.

## 2012-02-02 NOTE — Anesthesia Preprocedure Evaluation (Addendum)
Anesthesia Evaluation  Patient identified by MRN, date of birth, ID band Patient awake    Reviewed: Allergy & Precautions, H&P , Patient's Chart, lab work & pertinent test results  History of Anesthesia Complications Negative for: history of anesthetic complications  Airway Mallampati: I TM Distance: >3 FB Neck ROM: Full    Dental  (+) Teeth Intact and Dental Advisory Given   Pulmonary  breath sounds clear to auscultation        Cardiovascular negative cardio ROS  Rhythm:Regular Rate:Normal     Neuro/Psych  Headaches, Anxiety    GI/Hepatic   Endo/Other  Hypothyroidism   Renal/GU   Female GU complaint Polycystic ovaries     Musculoskeletal   Abdominal   Peds  Hematology   Anesthesia Other Findings Obese   Reproductive/Obstetrics                         Anesthesia Physical Anesthesia Plan  ASA: III  Anesthesia Plan: General   Post-op Pain Management:    Induction: Intravenous  Airway Management Planned: Oral ETT  Additional Equipment:   Intra-op Plan:   Post-operative Plan: Extubation in OR  Informed Consent: I have reviewed the patients History and Physical, chart, labs and discussed the procedure including the risks, benefits and alternatives for the proposed anesthesia with the patient or authorized representative who has indicated his/her understanding and acceptance.   Dental advisory given  Plan Discussed with: CRNA and Surgeon  Anesthesia Plan Comments: (HNP C5-6 C6-7 Obesity On metformin for polycystic kidney disease denies DM  Plan GA with oral ETT )        Anesthesia Quick Evaluation

## 2012-02-02 NOTE — Brief Op Note (Signed)
02/02/2012  10:00 AM  PATIENT:  Valerie Boone  37 y.o. female  PRE-OPERATIVE DIAGNOSIS:  Herniated Nucleus Pulposus/Degenerative Disc Disease  POST-OPERATIVE DIAGNOSIS:  Herniated Nucleus Pulposus/Degenerative Disc Disease  PROCEDURE:  Procedure(s) (LRB) with comments: ANTERIOR CERVICAL DECOMPRESSION/DISCECTOMY FUSION 2 LEVELS (N/A) - Cerivcal five-six, cervical six-seven anterior cervical decompression fusion with allograft and plating  SURGEON:  Surgeon(s) and Role:    * Temple Pacini, MD - Primary    * Karn Cassis, MD - Assisting  PHYSICIAN ASSISTANT:   ASSISTANTS:    ANESTHESIA:     EBL:  Total I/O In: 700 [I.V.:700] Out: 250 [Blood:250]  BLOOD ADMINISTERED:none  DRAINS: (Medium) Hemovact drain(s) in the Prevertebral space with  Suction Open   LOCAL MEDICATIONS USED:  NONE  SPECIMEN:  No Specimen  DISPOSITION OF SPECIMEN:  N/A  COUNTS:  YES  TOURNIQUET:  * No tourniquets in log *  DICTATION: .Dragon Dictation  PLAN OF CARE: Admit to inpatient   PATIENT DISPOSITION:  PACU - hemodynamically stable.   Delay start of Pharmacological VTE agent (>24hrs) due to surgical blood loss or risk of bleeding: yes

## 2012-02-02 NOTE — Discharge Summary (Signed)
Physician Discharge Summary  Patient ID: Valerie Boone MRN: [id number] DOB/AGE: November 07, 1975 36 y.o.  Admit date: 02/02/2012 Discharge date: 02/02/2012  Admission Diagnoses:  Discharge Diagnoses:  Principal Problem:  *Herniation of cervical intervertebral disc with radiculopathy   Discharged Condition: good  Hospital Course: Patient admitted to the hospital where she underwent uncomplicated 2 level anterior cervical decompression infusion. Postoperative she is done well. Neck and upper term a pain improved. Strength cessation intact. Ready for discharge home  Consults:   Significant Diagnostic Studies:   Treatments:   Discharge Exam: Blood pressure 130/79, pulse 104, temperature 98.4 F (36.9 C), temperature source Oral, resp. rate 20, SpO2 94.00%. Awake and alert oriented and appropriate. Cranial nerve function is intact. Motor sensory function extremities normal. Wound clean dry and intact next soft chest and abdomen benign.  Disposition: Final discharge disposition not confirmed     Medication List     As of 02/02/2012  6:20 PM    TAKE these medications         cyclobenzaprine 10 MG tablet   Commonly known as: FLEXERIL   Take 1 tablet (10 mg total) by mouth 3 (three) times daily as needed for muscle spasms.      HYDROcodone-acetaminophen 5-325 MG per tablet   Commonly known as: NORCO/VICODIN   Take 1-2 tablets by mouth every 4 (four) hours as needed.      levothyroxine 25 MCG tablet   Commonly known as: SYNTHROID, LEVOTHROID   Take 25 mcg by mouth daily.      metFORMIN 500 MG tablet   Commonly known as: GLUCOPHAGE   Take 500 mg by mouth 3 (three) times daily with meals.      multivitamin with minerals Tabs   Take 1 tablet by mouth daily.           Follow-up Information    Follow up with Nasier Thumm A, MD. Call in 1 week. (Ask for Lurena Joiner)    Contact information:   [address] [place] [address] [phone]           Signed: Temple Pacini 02/02/2012, 6:20 PM

## 2012-02-02 NOTE — Anesthesia Postprocedure Evaluation (Signed)
  Anesthesia Post-op Note  Patient: Valerie Boone  Procedure(s) Performed: Procedure(s) (LRB) with comments: ANTERIOR CERVICAL DECOMPRESSION/DISCECTOMY FUSION 2 LEVELS (N/A) - Cerivcal five-six, cervical six-seven anterior cervical decompression fusion with allograft and plating  Patient Location: PACU  Anesthesia Type:General  Level of Consciousness: awake, alert  and oriented  Airway and Oxygen Therapy: Patient Spontanous Breathing and Patient connected to nasal cannula oxygen  Post-op Pain: mild  Post-op Assessment: Post-op Vital signs reviewed, Patient's Cardiovascular Status Stable, Respiratory Function Stable, Patent Airway, No signs of Nausea or vomiting and Pain level controlled  Post-op Vital Signs: stable  Complications: No apparent anesthesia complications

## 2012-02-02 NOTE — Progress Notes (Addendum)
ANTIBIOTIC CONSULT NOTE - INITIAL  Pharmacy Consult for vancomycin Indication: post op prophylaxis s/p cervical disectomy and fusion.  Patient has a drain.  Allergies  Allergen Reactions  . Ampicillin Hives    High fevers  . Sulfa Antibiotics Hives    High fever    Patient Measurements:   Adjusted Body Weight:   Vital Signs: Temp: 98.9 F (37.2 C) (01/21 1123) Temp src: Oral (01/21 0620) BP: 124/85 mmHg (01/21 1123) Pulse Rate: 84  (01/21 1123) Intake/Output from previous day:   Intake/Output from this shift: Total I/O In: 1190 [P.O.:240; I.V.:950] Out: 250 [Blood:250]  Labs: No results found for this basename: WBC:3,HGB:3,PLT:3,LABCREA:3,CREATININE:3 in the last 72 hours CrCl is unknown because no creatinine reading has been taken and the patient has no height on file. No results found for this basename: VANCOTROUGH:2,VANCOPEAK:2,VANCORANDOM:2,GENTTROUGH:2,GENTPEAK:2,GENTRANDOM:2,TOBRATROUGH:2,TOBRAPEAK:2,TOBRARND:2,AMIKACINPEAK:2,AMIKACINTROU:2,AMIKACIN:2, in the last 72 hours   Microbiology: Recent Results (from the past 720 hour(s))  SURGICAL PCR SCREEN     Status: Normal   Collection Time   01/28/12  8:24 AM      Component Value Range Status Comment   MRSA, PCR NEGATIVE  NEGATIVE Final    Staphylococcus aureus NEGATIVE  NEGATIVE Final     Medical History: Past Medical History  Diagnosis Date  . Hypothyroidism   . Anxiety   . Claustrophobia   . Polycystic ovary disease   . Headache     tension - 3 to 4 times per week    Medications:  Scheduled:    . [COMPLETED] acetaminophen      . bacitracin      . HYDROmorphone      . levothyroxine  25 mcg Oral Daily  . metFORMIN  500 mg Oral TID WC  . multivitamin with minerals  1 tablet Oral Daily  . senna  1 tablet Oral BID  . sodium chloride      . sodium chloride  3 mL Intravenous Q12H  . [COMPLETED] vancomycin  1,500 mg Intravenous Once  . [DISCONTINUED] dexamethasone  10 mg Intravenous To OR    Infusions:   Assessment: 37 yo female post op prophylaxis s/p cervical disectomy and fusion. Patient has a drain. Wt 141 kg. Scr 0.68 (CrCl ~111). Had one dose of vancomycin 1500mg  iv x1 at 0715 on 01/21. To be discharged in am.  Goal of Therapy:  Vancomycin trough level 15-20 mcg/ml  Plan:  1) Continue vancomycin 1500mg  iv q12h, next dose will be at 1930  Velma Hanna, Tsz-Yin 02/02/2012,11:37 AM

## 2012-02-05 ENCOUNTER — Encounter (HOSPITAL_COMMUNITY): Payer: Self-pay | Admitting: Neurosurgery

## 2012-09-13 ENCOUNTER — Other Ambulatory Visit: Payer: Self-pay | Admitting: Neurosurgery

## 2012-09-13 DIAGNOSIS — M5412 Radiculopathy, cervical region: Secondary | ICD-10-CM

## 2012-09-18 ENCOUNTER — Ambulatory Visit
Admission: RE | Admit: 2012-09-18 | Discharge: 2012-09-18 | Disposition: A | Discharge status: Home / Self Care | Payer: 59 | Source: Ambulatory Visit | Attending: Neurosurgery | Admitting: Neurosurgery

## 2012-09-18 DIAGNOSIS — M5412 Radiculopathy, cervical region: Secondary | ICD-10-CM

## 2013-08-24 ENCOUNTER — Other Ambulatory Visit: Payer: Self-pay | Admitting: Obstetrics & Gynecology

## 2013-09-25 ENCOUNTER — Telehealth: Payer: Self-pay | Admitting: Hematology and Oncology

## 2013-09-25 NOTE — Telephone Encounter (Signed)
LEFT MESSAGE FOR PATIENT AND GAVE NP APPT FOR 09/22 @ 1:30 @ DR. Baxter. LEFT CONTACT INFORMATION FOR PATIENT TO RETURN CALL TO CONFIRM MESSAGE WAS RECEIVED.

## 2013-10-03 ENCOUNTER — Ambulatory Visit: Payer: 59 | Admitting: Hematology and Oncology

## 2013-10-03 ENCOUNTER — Ambulatory Visit: Payer: 59

## 2013-10-11 ENCOUNTER — Ambulatory Visit: Payer: 59 | Admitting: Hematology and Oncology

## 2013-10-11 ENCOUNTER — Ambulatory Visit: Payer: 59

## 2013-11-14 ENCOUNTER — Telehealth: Payer: Self-pay | Admitting: Hematology and Oncology

## 2013-11-14 NOTE — Telephone Encounter (Signed)
LEFT MESSAGE FOR PATIENT AND GAVE NP APPT FOR 11/19 @ 1:30 W/DR. [place].  CONTACT INFORMATION WAS LEFT FOR PATIENT TO RETURN CALL TO CONFIRM MESSAGE/NP APPT.

## 2013-11-30 ENCOUNTER — Ambulatory Visit: Payer: 59 | Admitting: Hematology and Oncology

## 2013-11-30 ENCOUNTER — Ambulatory Visit: Payer: 59

## 2014-01-02 ENCOUNTER — Ambulatory Visit: Payer: 59

## 2014-01-02 ENCOUNTER — Encounter: Payer: Self-pay | Admitting: Oncology

## 2014-01-02 ENCOUNTER — Other Ambulatory Visit: Payer: 59

## 2014-01-02 ENCOUNTER — Ambulatory Visit (HOSPITAL_BASED_OUTPATIENT_CLINIC_OR_DEPARTMENT_OTHER): Payer: 59 | Admitting: Oncology

## 2014-01-02 ENCOUNTER — Encounter (INDEPENDENT_AMBULATORY_CARE_PROVIDER_SITE_OTHER): Payer: Self-pay

## 2014-01-02 VITALS — BP 136/71 | HR 60 | Temp 97.6°F | Resp 18 | Ht 67.0 in | Wt 280.2 lb

## 2014-01-02 DIAGNOSIS — R58 Hemorrhage, not elsewhere classified: Secondary | ICD-10-CM

## 2014-01-02 NOTE — Progress Notes (Signed)
Please see consult note.  

## 2014-01-02 NOTE — Consult Note (Signed)
Reason for Referral: Menorrhagia and evaluation for von Willebrand's disease.   HPI: 38 year old woman native of [place] where she lived the majority of her life. She is currently working as a Education administrator. She has a history of hypothyroidism as well as polycystic ovary disease. She have also have had menorrhagia in the last few years. She was diagnosed with uterine fibroids and was evaluated by her primary care provider and had a CBC in December 2015 which showed a normal hemoglobin of 13.4 and normal platelets of 261. She also had a von Willebrand's screen which showed a von Willebrand's factor activity of 180% which is elevated. She also have noticed some occasional easy bruisability. She reports that she never had any bleeding complications in the past. She did have a tooth extraction few years ago without any postoperative bleeding. She also had a neck operation in 2014 and did not have any postoperative bleeding. She does not report any hematochezia or melena. She does report as mentioned menorrhagia but did not have any heavy menstrual bleeding prior to last few years. Clinically, she is relatively asymptomatic at this point. She does not report any headaches, blurry vision or syncope or seizures. She does not report any fevers, chills, sweats or weight loss. He does not report any nausea, vomiting, abdominal pain, change in her bowel habits. She does not report any frequency urgency or hesitancy. She does not report any skeletal complaints. Rest of her review of systems unremarkable.   Past Medical History  Diagnosis Date  . Hypothyroidism   . Anxiety   . Claustrophobia   . Polycystic ovary disease   . Headache     tension - 3 to 4 times per week  :  Past Surgical History  Procedure Laterality Date  . Tonsillectomy    . Hysteroscopy    . Anterior cervical decomp/discectomy fusion  02/02/2012    Procedure: ANTERIOR CERVICAL DECOMPRESSION/DISCECTOMY FUSION 2 LEVELS;  Surgeon:  Charlie Pitter, MD;  Location: [place] [facility];  Service: Neurosurgery;  Laterality: N/A;  Cerivcal five-six, cervical six-seven anterior cervical decompression fusion with allograft and plating  :   Current Outpatient Prescriptions  Medication Sig Dispense Refill  . cyclobenzaprine (FLEXERIL) 10 MG tablet Take 1 tablet (10 mg total) by mouth 3 (three) times daily as needed for muscle spasms. 30 tablet 1  . levothyroxine (SYNTHROID, LEVOTHROID) 25 MCG tablet Take 25 mcg by mouth daily.    . Multiple Vitamin (MULTIVITAMIN WITH MINERALS) TABS Take 1 tablet by mouth daily.     No current facility-administered medications for this visit.     Allergies  Allergen Reactions  . Ampicillin Hives    High fevers  . Sulfa Antibiotics Hives    High fever  :  No family history on file.:  History   Social History  . Marital Status: Married    Spouse Name: N/A    Number of Children: N/A  . Years of Education: N/A   Occupational History  . Not on file.   Social History Main Topics  . Smoking status: Never Smoker   . Smokeless tobacco: Not on file  . Alcohol Use: Yes     Comment: occasional  . Drug Use: No  . Sexual Activity: Not on file   Other Topics Concern  . Not on file   Social History Narrative  . No narrative on file  :  Pertinent items are noted in HPI.  Exam: ECOG 0 Blood pressure 136/71, pulse 60, temperature 97.6  F (36.4 C), temperature source Oral, resp. rate 18, height 5\' 7"  (1.702 m), weight 280 lb 3.2 oz (127.098 kg), SpO2 99 %. General appearance: alert and cooperative Head: Normocephalic, without obvious abnormality Throat: lips, mucosa, and tongue normal; teeth and gums normal Neck: no adenopathy, no carotid bruit and no JVD Back: negative Resp: clear to auscultation bilaterally Cardio: regular rate and rhythm, S1, S2 normal, no murmur, click, rub or gallop GI: soft, non-tender; bowel sounds normal; no masses,  no organomegaly Extremities: extremities  normal, atraumatic, no cyanosis or edema Pulses: 2+ and symmetric Skin: Skin color, texture, turgor normal. No rashes or lesions. I could not appreciate any petechiae or ecchymosis. Lymph nodes: Cervical, supraclavicular, and axillary nodes normal.  CBC    Component Value Date/Time   WBC 10.2 01/28/2012 0824   RBC 4.81 01/28/2012 0824   HGB 13.4 01/28/2012 0824   HCT 40.4 01/28/2012 0824   PLT 273 01/28/2012 0824   MCV 84.0 01/28/2012 0824   MCH 27.9 01/28/2012 0824   MCHC 33.2 01/28/2012 0824   RDW 14.8 01/28/2012 0824   LYMPHSABS 2.5 01/28/2012 0824   MONOABS 0.6 01/28/2012 0824   EOSABS 0.1 01/28/2012 0824   BASOSABS 0.0 01/28/2012 0824      Assessment and Plan:   38 year old woman with the following issues:  1. Von Willebrand screen: Her laboratory testing was reviewed today and her von Willebrand factor activity is elevated which actually goes against a diagnosis of von Willebrand's disease. A decreased von Willebrand's activity would be a hint for further workup. The reason why her laboratory testing indicate slight increase in the activity is likely related to her thyroid disease. Her history does not suggest von Willebrand's disease with really no strong family history she also had multiple surgeries without any postoperative bleeding. She would've had neck operation as well as teeth extraction with very little bleeding. Her menorrhagia is recent and not documented growing up. I see no need for any further testing as the likelihood of a bleeding disorder is very small.  2. Menorrhagia: I think this is related to uterine fibroids and possibly related to her endocrine disorder rather than a hematological disorder. She is currently under evaluation by gynecology with an IUD in place. She also contemplating potential partial hysterectomy.   3. Iron deficiency anemia: She seems to be well compensated with oral iron but I discussed with her the potential need for IV iron in the  future should she develop profound anemia.  All her questions were answered today to his satisfaction and I'll be happy to see her in the future as needed.

## 2014-01-02 NOTE — Progress Notes (Signed)
Checked in new patient with no issues prior to seeing dr. She has appt card and has not been traveling,.

## 2014-10-31 ENCOUNTER — Other Ambulatory Visit: Payer: Self-pay | Admitting: Obstetrics & Gynecology

## 2014-10-31 ENCOUNTER — Other Ambulatory Visit (HOSPITAL_COMMUNITY)
Admission: RE | Admit: 2014-10-31 | Discharge: 2014-10-31 | Disposition: A | Discharge status: Home / Self Care | Payer: 59 | Source: Ambulatory Visit | Attending: Obstetrics & Gynecology | Admitting: Obstetrics & Gynecology

## 2014-10-31 DIAGNOSIS — Z1151 Encounter for screening for human papillomavirus (HPV): Secondary | ICD-10-CM | POA: Insufficient documentation

## 2014-10-31 DIAGNOSIS — Z01419 Encounter for gynecological examination (general) (routine) without abnormal findings: Secondary | ICD-10-CM | POA: Diagnosis present

## 2014-11-01 ENCOUNTER — Other Ambulatory Visit: Payer: Self-pay | Admitting: Obstetrics & Gynecology

## 2014-11-01 DIAGNOSIS — N6310 Unspecified lump in the right breast, unspecified quadrant: Secondary | ICD-10-CM

## 2014-11-01 LAB — CYTOLOGY - PAP

## 2014-11-07 ENCOUNTER — Other Ambulatory Visit: Payer: Self-pay

## 2014-11-08 ENCOUNTER — Ambulatory Visit
Admission: RE | Admit: 2014-11-08 | Discharge: 2014-11-08 | Disposition: A | Discharge status: Home / Self Care | Payer: 59 | Source: Ambulatory Visit | Attending: Obstetrics & Gynecology | Admitting: Obstetrics & Gynecology

## 2014-11-08 DIAGNOSIS — N6310 Unspecified lump in the right breast, unspecified quadrant: Secondary | ICD-10-CM

## 2015-09-02 ENCOUNTER — Other Ambulatory Visit: Payer: Self-pay | Admitting: Family Medicine

## 2015-09-02 DIAGNOSIS — M5431 Sciatica, right side: Secondary | ICD-10-CM

## 2015-09-02 DIAGNOSIS — R102 Pelvic and perineal pain: Secondary | ICD-10-CM

## 2015-09-02 DIAGNOSIS — D259 Leiomyoma of uterus, unspecified: Secondary | ICD-10-CM

## 2015-09-12 ENCOUNTER — Ambulatory Visit
Admission: RE | Admit: 2015-09-12 | Discharge: 2015-09-12 | Disposition: A | Discharge status: Home / Self Care | Payer: 59 | Source: Ambulatory Visit | Attending: Family Medicine | Admitting: Family Medicine

## 2015-09-12 DIAGNOSIS — M5431 Sciatica, right side: Secondary | ICD-10-CM

## 2015-09-17 ENCOUNTER — Other Ambulatory Visit: Payer: 59

## 2015-09-20 ENCOUNTER — Ambulatory Visit
Admission: RE | Admit: 2015-09-20 | Discharge: 2015-09-20 | Disposition: A | Discharge status: Home / Self Care | Payer: 59 | Source: Ambulatory Visit | Attending: Family Medicine | Admitting: Family Medicine

## 2015-09-20 DIAGNOSIS — R102 Pelvic and perineal pain: Secondary | ICD-10-CM

## 2015-09-20 DIAGNOSIS — D259 Leiomyoma of uterus, unspecified: Secondary | ICD-10-CM

## 2016-03-05 ENCOUNTER — Other Ambulatory Visit: Payer: Self-pay | Admitting: Family Medicine

## 2016-03-05 DIAGNOSIS — Z1231 Encounter for screening mammogram for malignant neoplasm of breast: Secondary | ICD-10-CM

## 2016-03-18 ENCOUNTER — Ambulatory Visit: Payer: 59

## 2016-04-02 ENCOUNTER — Ambulatory Visit
Admission: RE | Admit: 2016-04-02 | Discharge: 2016-04-02 | Disposition: A | Discharge status: Home / Self Care | Payer: 59 | Source: Ambulatory Visit | Attending: Family Medicine | Admitting: Family Medicine

## 2016-04-02 DIAGNOSIS — Z1231 Encounter for screening mammogram for malignant neoplasm of breast: Secondary | ICD-10-CM

## 2016-06-11 ENCOUNTER — Observation Stay (HOSPITAL_BASED_OUTPATIENT_CLINIC_OR_DEPARTMENT_OTHER)
Admission: EM | Admit: 2016-06-11 | Discharge: 2016-06-12 | Disposition: A | Discharge status: Home / Self Care | Payer: 59 | Attending: Family Medicine | Admitting: Family Medicine

## 2016-06-11 ENCOUNTER — Encounter (HOSPITAL_BASED_OUTPATIENT_CLINIC_OR_DEPARTMENT_OTHER): Payer: Self-pay | Admitting: Emergency Medicine

## 2016-06-11 DIAGNOSIS — R51 Headache: Secondary | ICD-10-CM | POA: Insufficient documentation

## 2016-06-11 DIAGNOSIS — R42 Dizziness and giddiness: Secondary | ICD-10-CM

## 2016-06-11 DIAGNOSIS — H8120 Vestibular neuronitis, unspecified ear: Principal | ICD-10-CM | POA: Diagnosis present

## 2016-06-11 DIAGNOSIS — H8110 Benign paroxysmal vertigo, unspecified ear: Secondary | ICD-10-CM | POA: Diagnosis not present

## 2016-06-11 DIAGNOSIS — E039 Hypothyroidism, unspecified: Secondary | ICD-10-CM | POA: Diagnosis not present

## 2016-06-11 DIAGNOSIS — Z79899 Other long term (current) drug therapy: Secondary | ICD-10-CM | POA: Insufficient documentation

## 2016-06-11 LAB — CBC
HEMATOCRIT: 38.1 % (ref 36.0–46.0)
Hemoglobin: 12.4 g/dL (ref 12.0–15.0)
MCH: 28.5 pg (ref 26.0–34.0)
MCHC: 32.5 g/dL (ref 30.0–36.0)
MCV: 87.6 fL (ref 78.0–100.0)
PLATELETS: 259 10*3/uL (ref 150–400)
RBC: 4.35 MIL/uL (ref 3.87–5.11)
RDW: 14 % (ref 11.5–15.5)
WBC: 14.6 10*3/uL — AB (ref 4.0–10.5)

## 2016-06-11 LAB — COMPREHENSIVE METABOLIC PANEL
ALT: 26 U/L (ref 14–54)
AST: 36 U/L (ref 15–41)
Albumin: 4.1 g/dL (ref 3.5–5.0)
Alkaline Phosphatase: 76 U/L (ref 38–126)
Anion gap: 10 (ref 5–15)
BUN: 13 mg/dL (ref 6–20)
CHLORIDE: 103 mmol/L (ref 101–111)
CO2: 24 mmol/L (ref 22–32)
Calcium: 8.6 mg/dL — ABNORMAL LOW (ref 8.9–10.3)
Creatinine, Ser: 0.76 mg/dL (ref 0.44–1.00)
Glucose, Bld: 144 mg/dL — ABNORMAL HIGH (ref 65–99)
POTASSIUM: 3.6 mmol/L (ref 3.5–5.1)
SODIUM: 137 mmol/L (ref 135–145)
Total Bilirubin: 0.7 mg/dL (ref 0.3–1.2)
Total Protein: 7.2 g/dL (ref 6.5–8.1)

## 2016-06-11 MED ORDER — DIAZEPAM 5 MG PO TABS
5.0000 mg | ORAL_TABLET | Freq: Once | ORAL | Status: AC
  Administered 2016-06-11: 5 mg via ORAL
  Filled 2016-06-11: qty 1

## 2016-06-11 MED ORDER — METOCLOPRAMIDE HCL 5 MG/ML IJ SOLN
10.0000 mg | Freq: Once | INTRAMUSCULAR | Status: AC
  Administered 2016-06-11: 10 mg via INTRAVENOUS
  Filled 2016-06-11: qty 2

## 2016-06-11 MED ORDER — LORAZEPAM 2 MG/ML IJ SOLN
1.0000 mg | Freq: Once | INTRAMUSCULAR | Status: AC
  Administered 2016-06-12: 1 mg via INTRAVENOUS
  Filled 2016-06-11: qty 1

## 2016-06-11 MED ORDER — MECLIZINE HCL 25 MG PO TABS
25.0000 mg | ORAL_TABLET | Freq: Once | ORAL | Status: AC
  Administered 2016-06-11: 25 mg via ORAL
  Filled 2016-06-11: qty 1

## 2016-06-11 MED ORDER — ONDANSETRON HCL 4 MG/2ML IJ SOLN
4.0000 mg | Freq: Once | INTRAMUSCULAR | Status: AC | PRN
  Administered 2016-06-11: 4 mg via INTRAVENOUS

## 2016-06-11 MED ORDER — SODIUM CHLORIDE 0.9 % IV BOLUS (SEPSIS)
1000.0000 mL | Freq: Once | INTRAVENOUS | Status: AC
  Administered 2016-06-11: 1000 mL via INTRAVENOUS

## 2016-06-11 MED ORDER — LORAZEPAM 2 MG/ML IJ SOLN
1.0000 mg | Freq: Once | INTRAMUSCULAR | Status: AC
  Administered 2016-06-11: 1 mg via INTRAVENOUS
  Filled 2016-06-11: qty 1

## 2016-06-11 MED ORDER — ONDANSETRON HCL 4 MG/2ML IJ SOLN
INTRAMUSCULAR | Status: AC
  Filled 2016-06-11: qty 2

## 2016-06-11 NOTE — ED Notes (Signed)
ED Provider at bedside. 

## 2016-06-11 NOTE — ED Notes (Signed)
PER CARELINK: pt was transferred via EMS to [facility] to have an MRI done. Pt developed sudden onset of dizziness and headache today at 3pm. Pt laying in bed with wet cloth over her eyes. She is speaking in clear complete sentences. She denies nausea when laying down, nausea only with sitting up. She states her headache has went away.

## 2016-06-11 NOTE — ED Triage Notes (Signed)
Patient has had dizziness and N/V since last night  - patient with cloth over her eyes  - reports that the dizziness makes that nausea worse, also sitting up makes the nausea worse

## 2016-06-11 NOTE — ED Provider Notes (Signed)
Patient transferred from [facility] due to abrupt onset of severe vertigo. Patient has had Valium and meclizine without improvement. She did receive IV Ativan on the way here. She states that helped with the dizziness with sitting still but when she moves her head she still feels dizzy. She denies any unilateral symptoms of weakness or numbness. She denies any history of syncope.  Patient being sent here due to persistent symptoms that are not improving with therapy to get MRI for further evaluation. Patient was given a second dose of IV Ativan. MRI pending   Blanchie Dessert, MD 06/11/16 2215

## 2016-06-11 NOTE — ED Notes (Signed)
Reports incontinence with dizzy episodes. Denies hx of vertigo but reports migraine hx with use of OTC meds. PERRLA. Denies LOC.

## 2016-06-11 NOTE — ED Provider Notes (Signed)
[facility] DEPT [facility] Provider Note   CSN: [id number] Arrival date & time: 06/11/16  1727   By signing my name below, I, Eunice Blase, attest that this documentation has been prepared under the direction and in the presence of Harlene Ramus, [place]. Electronically Signed: Eunice Blase, Scribe. 06/11/16. 6:12 PM.   History   Chief Complaint Chief Complaint  Patient presents with  . Dizziness   The history is provided by the patient and medical records. No language interpreter was used.    ARDIE DRAGOO is a 41 y.o. female presenting to the Emergency Department with chief complaint of sudden onset dizziness starting while standing at work ~ 3:00 PM today. She states this has been constant since onset. Pt describes dizziness as room spinning sensation with nausea and vomiting beginning shortly after onset. She also states her symptoms are worsened when turning the head too fast and, standing and opening the eyes; she states her symptoms are improved with eyes closed and lying down and remaining still. She reports having a mild headache which she states this is consistent with h/o migraines. Pt taking medications to control hypothyroidism regularly at home and states she was recently started on lisinopril without any known complications from any of these medications. No h/o similar symptoms noted. No fever, visual changes, rhinorrhea, chest pain, SOB, neck pain or stiffness, abdominal pain, diarrhea, dysuria, numbness, tingling, other pain or weakness in the body. No other complaints at this time. Denies hx of vertigo.  Past Medical History:  Diagnosis Date  . Anxiety   . Claustrophobia   . Headache(784.0)    tension - 3 to 4 times per week  . Hypothyroidism   . Polycystic ovary disease     Patient Active Problem List   Diagnosis Date Noted  . Herniation of cervical intervertebral disc with radiculopathy 02/02/2012    Past Surgical History:  Procedure Laterality Date  . ANTERIOR  CERVICAL DECOMP/DISCECTOMY FUSION  02/02/2012   Procedure: ANTERIOR CERVICAL DECOMPRESSION/DISCECTOMY FUSION 2 LEVELS;  Surgeon: Charlie Pitter, MD;  Location: [place] [facility];  Service: Neurosurgery;  Laterality: N/A;  Cerivcal five-six, cervical six-seven anterior cervical decompression fusion with allograft and plating  . HYSTEROSCOPY    . TONSILLECTOMY      OB History    No data available       Home Medications    Prior to Admission medications   Medication Sig Start Date End Date Taking? Authorizing Provider  cyclobenzaprine (FLEXERIL) 10 MG tablet Take 1 tablet (10 mg total) by mouth 3 (three) times daily as needed for muscle spasms. 02/02/12   Earnie Larsson, MD  levothyroxine (SYNTHROID, LEVOTHROID) 25 MCG tablet Take 25 mcg by mouth daily.    [provider]  Multiple Vitamin (MULTIVITAMIN WITH MINERALS) TABS Take 1 tablet by mouth daily.    [provider]    Family History History reviewed. No pertinent family history.  Social History Social History  Substance Use Topics  . Smoking status: Never Smoker  . Smokeless tobacco: Never Used  . Alcohol use Yes     Comment: occasional     Allergies   Ampicillin; Sulfa antibiotics; and Topiramate   Review of Systems Review of Systems  HENT: Negative for congestion and rhinorrhea.   Eyes: Negative for visual disturbance.  Respiratory: Negative for shortness of breath.   Cardiovascular: Negative for chest pain.  Gastrointestinal: Positive for nausea and vomiting. Negative for abdominal pain and diarrhea.  Musculoskeletal: Negative for neck pain and neck  stiffness.  Neurological: Positive for dizziness and headaches. Negative for weakness and numbness.  All other systems reviewed and are negative.    Physical Exam Updated Vital Signs BP (!) 142/88 (BP Location: Right Arm)   Pulse 74   Temp 98.4 F (36.9 C) (Oral)   Resp 18   Ht 5\' 8"  (1.727 m)   Wt 280 lb (127 kg)   SpO2 100%   BMI 42.57 kg/m    Physical Exam  Constitutional: She is oriented to person, place, and time. She appears well-developed and well-nourished. No distress.  Pt laying still in bed with eyes closed and towel placed over her head.   HENT:  Head: Normocephalic and atraumatic.  Mouth/Throat: Uvula is midline, oropharynx is clear and moist and mucous membranes are normal. No oropharyngeal exudate, posterior oropharyngeal edema, posterior oropharyngeal erythema or tonsillar abscesses. No tonsillar exudate.  Eyes: Conjunctivae and lids are normal. Pupils are equal, round, and reactive to light. Lids are everted and swept, no foreign bodies found. Right eye exhibits no discharge. Left eye exhibits no discharge. No scleral icterus. Right eye exhibits nystagmus. Left eye exhibits nystagmus.  Horizontal nystagmus present to bilaterally with opening of eyes in all directions.   Neck: Normal range of motion. Neck supple.  Cardiovascular: Normal rate, regular rhythm, normal heart sounds and intact distal pulses.   Pulmonary/Chest: Effort normal and breath sounds normal. No respiratory distress. She has no wheezes. She has no rales. She exhibits no tenderness.  Abdominal: Soft. Bowel sounds are normal. She exhibits no distension and no mass. There is no tenderness. There is no rebound and no guarding.  Musculoskeletal: Normal range of motion. She exhibits no edema.  Neurological: She is alert and oriented to person, place, and time. She has normal strength. No cranial nerve deficit or sensory deficit. She displays a negative Romberg sign. Coordination normal.  Skin: Skin is warm and dry. She is not diaphoretic.  Nursing note and vitals reviewed.    ED Treatments / Results  DIAGNOSTIC STUDIES: Oxygen Saturation is 100% on RA, NL by my interpretation.    COORDINATION OF CARE: 6:08 PM-Discussed next steps with pt. Pt verbalized understanding and is agreeable with the plan. Will order medications.   Labs (all labs ordered  are listed, but only abnormal results are displayed) Labs Reviewed  COMPREHENSIVE METABOLIC PANEL  CBC    EKG  EKG Interpretation None       Radiology No results found.  Procedures Procedures (including critical care time)  Medications Ordered in ED Medications  ondansetron (ZOFRAN) 4 MG/2ML injection (not administered)  ondansetron (ZOFRAN) injection 4 mg (4 mg Intravenous Given 06/11/16 1825)  diazepam (VALIUM) tablet 5 mg (5 mg Oral Given 06/11/16 1831)  meclizine (ANTIVERT) tablet 25 mg (25 mg Oral Given 06/11/16 1831)  sodium chloride 0.9 % bolus 1,000 mL (1,000 mLs Intravenous New Bag/Given 06/11/16 1811)  metoCLOPramide (REGLAN) injection 10 mg (10 mg Intravenous Given 06/11/16 1946)  LORazepam (ATIVAN) injection 1 mg (1 mg Intravenous Given 06/11/16 1948)     Initial Impression / Assessment and Plan / ED Course  I have reviewed the triage vital signs and the nursing notes.  Pertinent labs & imaging results that were available during my care of the patient were reviewed by me and considered in my medical decision making (see chart for details).     Patient presents with sudden onset dizziness which she describes as room spinning with associated nausea and vomiting that started around 3 PM while  she was standing at work. Symptoms worse with head movement, change of position or having her eyes remain open. Denies history of vertigo. VSS. Exam showed pt with constant horizontal nystagmus bilaterally. Neuro exam unremarkable. Patient given Valium, meclizine and Zofran in the ED. On reevaluation patient reports significant worsening of symptoms when she sat up to try to use the restroom. Patient was found vomiting in her room. Patient given another dose of antiemetics and IV Ativan. Due to patient with worsening of symptoms and no improvement after initial meds, plan to transfer to [facility] to have MRI performed for further evaluation of dizziness. Discussed results and plan for  transfer with patient. Plan to perform MRI and reevaluate. If MRI is negative. Patient continues to remain symptomatic I suspect she will likely need admission for continued management and treatment of her symptoms.  Final Clinical Impressions(s) / ED Diagnoses   Final diagnoses:  Dizziness    New Prescriptions New Prescriptions   No medications on file   I personally performed the services described in this documentation, which was scribed in my presence. The recorded information has been reviewed and is accurate.    Nona Dell, PA-C 06/11/16 1952    Tegeler, Gwenyth Allegra, MD 06/12/16 1329

## 2016-06-12 ENCOUNTER — Emergency Department (HOSPITAL_COMMUNITY): Payer: 59

## 2016-06-12 DIAGNOSIS — H8110 Benign paroxysmal vertigo, unspecified ear: Secondary | ICD-10-CM

## 2016-06-12 DIAGNOSIS — R42 Dizziness and giddiness: Secondary | ICD-10-CM

## 2016-06-12 DIAGNOSIS — H8120 Vestibular neuronitis, unspecified ear: Secondary | ICD-10-CM | POA: Diagnosis present

## 2016-06-12 LAB — HIV ANTIBODY (ROUTINE TESTING W REFLEX): HIV Screen 4th Generation wRfx: NONREACTIVE

## 2016-06-12 MED ORDER — SODIUM CHLORIDE 0.9 % IV SOLN
INTRAVENOUS | Status: DC
  Administered 2016-06-12 (×2): via INTRAVENOUS

## 2016-06-12 MED ORDER — ADULT MULTIVITAMIN W/MINERALS CH
1.0000 | ORAL_TABLET | Freq: Every day | ORAL | Status: DC
  Administered 2016-06-12: 1 via ORAL
  Filled 2016-06-12: qty 1

## 2016-06-12 MED ORDER — ONDANSETRON HCL 4 MG/2ML IJ SOLN: 4.0000 mg | Freq: Four times a day (QID) | INTRAMUSCULAR | Status: DC | PRN

## 2016-06-12 MED ORDER — LEVOTHYROXINE SODIUM 25 MCG PO TABS
25.0000 ug | ORAL_TABLET | Freq: Every day | ORAL | Status: DC
  Administered 2016-06-12: 25 ug via ORAL
  Filled 2016-06-12 (×2): qty 1

## 2016-06-12 MED ORDER — MECLIZINE HCL 25 MG PO TABS
25.0000 mg | ORAL_TABLET | Freq: Three times a day (TID) | ORAL | Status: DC | PRN
  Administered 2016-06-12: 25 mg via ORAL
  Filled 2016-06-12: qty 1

## 2016-06-12 MED ORDER — ESCITALOPRAM OXALATE 10 MG PO TABS
5.0000 mg | ORAL_TABLET | Freq: Every day | ORAL | Status: DC
  Administered 2016-06-12: 5 mg via ORAL
  Filled 2016-06-12: qty 1

## 2016-06-12 MED ORDER — PREDNISONE 20 MG PO TABS: ORAL_TABLET | ORAL | 0 refills | Status: DC

## 2016-06-12 NOTE — ED Provider Notes (Signed)
12:00 AM  Assumed care from Dr. Maryan Rued.  Pt is a 41 year old healthy female who presents emergency department with sudden onset vertigo. Patient unable to ambulate because of vertigo or open her eyes. Having nausea and vomiting. Has now received oral meclizine, oral volume and 2 doses of IV Ativan. His symptoms slowly improving. Sent from [facility] emergency department to have a noncontrast MRI of her brain for further evaluation.  2:00 AM  Pt's MRI of her brain is normal. Suspect this is peripheral vertigo. Symptoms have improved but she still has significant vertigo with just opening her eyes. She is now able to sit upright but cannot stand or walk. I feel she will need admission for symptomatic relief. PCP is Dr. Justin Mend. Will discuss with hospitalist.  2:10 AM Discussed patient's case with hospitalist, Dr. Alcario Drought.  I have recommended admission and patient (and family if present) agree with this plan. Admitting physician will place admission orders.   I reviewed all nursing notes, vitals, pertinent previous records, EKGs, lab and urine results, imaging (as available).     Jeancarlos Marchena, Delice Bison, DO 06/12/16 0211

## 2016-06-12 NOTE — H&P (Signed)
History and Physical    Boone Valerie FYB:[id number] DOB: 1975/05/31 DOA: 06/11/2016  PCP: Maurice Small, MD  Patient coming from: Home  I have personally briefly reviewed patient's old medical records in [place]  Chief Complaint: Vertigo  HPI: Valerie Boone is a 41 y.o. female with medical history significant of hypothyroid, PCOS, headaches.  Patient presented to [facility] with abrupt onset of severe vertigo.  Symptoms not helped by valium nor meclizine.  [facility] where [facility] brain was performed and was negative.  Vertigo better laying down flat with eyes closed, becomes severe when she sits up with eyes open.  Review of Systems: As per HPI otherwise 10 point review of systems negative.   Past Medical History:  Diagnosis Date  . Anxiety   . Claustrophobia   . Headache(784.0)    tension - 3 to 4 times per week  . Hypothyroidism   . Polycystic ovary disease     Past Surgical History:  Procedure Laterality Date  . ANTERIOR CERVICAL DECOMP/DISCECTOMY FUSION  02/02/2012   Procedure: ANTERIOR CERVICAL DECOMPRESSION/DISCECTOMY FUSION 2 LEVELS;  Surgeon: Charlie Pitter, MD;  Location: [place] [facility];  Service: Neurosurgery;  Laterality: N/A;  Cerivcal five-six, cervical six-seven anterior cervical decompression fusion with allograft and plating  . HYSTEROSCOPY    . TONSILLECTOMY       reports that she has never smoked. She has never used smokeless tobacco. She reports that she drinks alcohol. She reports that she does not use drugs.  Allergies  Allergen Reactions  . Ampicillin Hives    High fevers  . Sulfa Antibiotics Hives    High fever  . Topiramate Other (See Comments)    Memory loss, tingling and loss of feeling in extremities.     History reviewed. No pertinent family history.   Prior to Admission medications   Medication Sig Start Date End Date Taking? Authorizing Provider  escitalopram (LEXAPRO) 5 MG tablet Take 5 mg by mouth daily.   Yes [provider]  levothyroxine (SYNTHROID, LEVOTHROID) 25 MCG tablet Take 25 mcg by mouth daily.   Yes [provider]  Multiple Vitamin (MULTIVITAMIN WITH MINERALS) TABS Take 1 tablet by mouth daily.   Yes [provider]    Physical Exam: Vitals:   06/11/16 2200 06/11/16 2230 06/11/16 2300 06/11/16 2330  BP: 127/74 127/81 119/71 117/71  Pulse: 61 74 68 63  Resp: 12 (!) 30 19 19   Temp:      TempSrc:      SpO2: 97% 99% 97% 98%  Weight:      Height:        Constitutional: NAD, calm, comfortable Eyes: Horizontal nystagmus bilaterally with eyes open. ENMT: Mucous membranes are moist. Posterior pharynx clear of any exudate or lesions.Normal dentition.  Neck: normal, supple, no masses, no thyromegaly Respiratory: clear to auscultation bilaterally, no wheezing, no crackles. Normal respiratory effort. No accessory muscle use.  Cardiovascular: Regular rate and rhythm, no murmurs / rubs / gallops. No extremity edema. 2+ pedal pulses. No carotid bruits.  Abdomen: no tenderness, no masses palpated. No hepatosplenomegaly. Bowel sounds positive.  Musculoskeletal: no clubbing / cyanosis. No joint deformity upper and lower extremities. Good ROM, no contractures. Normal muscle tone.  Skin: no rashes, lesions, ulcers. No induration Neurologic: CN 2-12 grossly intact. Sensation intact, DTR normal. Strength 5/5 in all 4.  Psychiatric: Normal judgment and insight. Alert and oriented x 3. Normal mood.    Labs on Admission: I have  personally reviewed following labs and imaging studies  CBC:  Recent Labs Lab 06/11/16 1752  WBC 14.6*  HGB 12.4  HCT 38.1  MCV 87.6  PLT 103   Basic Metabolic Panel:  Recent Labs Lab 06/11/16 1752  NA 137  K 3.6  CL 103  CO2 24  GLUCOSE 144*  BUN 13  CREATININE 0.76  CALCIUM 8.6*   GFR: Estimated Creatinine Clearance: 130.2 mL/min (by C-G formula based on SCr of 0.76 mg/dL). Liver Function Tests:  Recent Labs Lab 06/11/16 1752    AST 36  ALT 26  ALKPHOS 76  BILITOT 0.7  PROT 7.2  ALBUMIN 4.1   No results for input(s): LIPASE, AMYLASE in the last 168 hours. No results for input(s): AMMONIA in the last 168 hours. Coagulation Profile: No results for input(s): INR, PROTIME in the last 168 hours. Cardiac Enzymes: No results for input(s): CKTOTAL, CKMB, CKMBINDEX, TROPONINI in the last 168 hours. BNP (last 3 results) No results for input(s): PROBNP in the last 8760 hours. HbA1C: No results for input(s): HGBA1C in the last 72 hours. CBG: No results for input(s): GLUCAP in the last 168 hours. Lipid Profile: No results for input(s): CHOL, HDL, LDLCALC, TRIG, CHOLHDL, LDLDIRECT in the last 72 hours. Thyroid Function Tests: No results for input(s): TSH, T4TOTAL, FREET4, T3FREE, THYROIDAB in the last 72 hours. Anemia Panel: No results for input(s): VITAMINB12, FOLATE, FERRITIN, TIBC, IRON, RETICCTPCT in the last 72 hours. Urine analysis: No results found for: COLORURINE, APPEARANCEUR, LABSPEC, PHURINE, GLUCOSEU, HGBUR, BILIRUBINUR, KETONESUR, PROTEINUR, UROBILINOGEN, NITRITE, LEUKOCYTESUR  Radiological Exams on Admission: Mr Brain Wo Contrast  Result Date: 06/12/2016 CLINICAL DATA:  Dizziness EXAM: MRI HEAD WITHOUT CONTRAST TECHNIQUE: Multiplanar, multiecho pulse sequences of the brain and surrounding structures were obtained without intravenous contrast. COMPARISON:  None FINDINGS: Brain: The midline structures are normal. There is no focal diffusion restriction to indicate acute infarct. Single focus of hyperintense T2 weighted signal in the left frontal white matter, within normal limits for age. No intraparenchymal hematoma or chronic microhemorrhage. Brain volume is normal for age without age-advanced or lobar predominant atrophy. The dura is normal and there is no extra-axial collection. Vascular: Major intracranial arterial and venous sinus flow voids are preserved. Skull and upper cervical spine: The visualized  skull base, calvarium, upper cervical spine and extracranial soft tissues are normal. Sinuses/Orbits: No fluid levels or advanced mucosal thickening. No mastoid effusion. Normal orbits. IMPRESSION: Normal MRI of the brain for age. Electronically Signed   By: Ulyses Jarred M.D.   On: 06/12/2016 01:24    EKG: Independently reviewed.  Assessment/Plan Principal Problem:   BPV (benign positional vertigo)    1. BPV - 1. Antivert and zofran 2. Physical therapy to eval and treat in AM.  DVT prophylaxis: Low VTE risk Code Status: Full Family Communication: Family at bedside Disposition Plan: Home, likely today if PT treatment is sucessful Consults called: PT Admission status: Place in 73, [place] [facility] Pager [phone]  If 7AM-7PM, please contact day team taking care of patient [email or web address] Password [email or web address]  06/12/2016, 2:24 AM

## 2016-06-12 NOTE — Discharge Summary (Signed)
Physician Discharge Summary  Valerie Boone:[id number] DOB: [birth year] DOA: 06/11/2016  PCP: Maurice Small, MD  Admit date: 06/11/2016 Discharge date: 06/12/2016  Admitted From: Home Disposition: Home  Recommendations for Outpatient Follow-up:  1. Follow up with PCP in 1 week 2. Outpatient PT 3. Steroid taper  Home Health: Outpatient PT Equipment/Devices: Rolling walker  Discharge Condition: Stable CODE STATUS: Full code Diet recommendation: Regular diet   Brief/Interim Summary:  Admission HPI written by Valerie Quill, DO   Chief Complaint: Vertigo  HPI: Valerie Boone is a 41 y.o. female with medical history significant of hypothyroid, PCOS, headaches.  Patient presented to [facility] with abrupt onset of severe vertigo.  Symptoms not helped by valium nor meclizine.  [facility] and was negative.  Vertigo better laying down flat with eyes closed, becomes severe when she sits up with eyes open.    Hospital course:  Vestibular neuritis MRI negative for acute infarct. Patient with nystagmus at rest. Likely vestibular neuritis rather than BPPV. Physical therapy worked with patient and recommended outpatient physical therapy. Patient tolerated ambulation with a walker. Steroid taper at discharge. Outpatient follow-up. Return precautions.  Discharge Diagnoses:  Principal Problem:   Vestibular neuritis    Discharge Instructions  Discharge Instructions    Call MD for:  persistant dizziness or light-headedness    Complete by:  As directed      Allergies as of 06/12/2016      Reactions   Ampicillin Hives   High fevers   Sulfa Antibiotics Hives   High fever   Topiramate Other (See Comments)   Memory loss, tingling and loss of feeling in extremities.       Medication List    TAKE these medications   escitalopram 5 MG tablet Commonly known as:  LEXAPRO Take 5 mg by mouth daily.   levothyroxine 25 MCG tablet Commonly known  as:  SYNTHROID, LEVOTHROID Take 25 mcg by mouth daily.   multivitamin with minerals Tabs tablet Take 1 tablet by mouth daily.   predniSONE 20 MG tablet Commonly known as:  DELTASONE Take 60mg  x 2 days, 40mg  x 2 days, 20mg  x 3 days, 10mg  x 4 days            Durable Medical Equipment        Start     Ordered   06/12/16 1431  For home use only DME Walker rolling  Once    Question:  Patient needs a walker to treat with the following condition  Answer:  Vestibular neuritis   06/12/16 1430     Follow-up Information    Maurice Small, MD. Schedule an appointment as soon as possible for a visit in 1 week(s).   Specialty:  Family Medicine Contact information: [address] [place] [address] [phone]          Allergies  Allergen Reactions  . Ampicillin Hives    High fevers  . Sulfa Antibiotics Hives    High fever  . Topiramate Other (See Comments)    Memory loss, tingling and loss of feeling in extremities.     Consultations:  Physical therapy   Procedures/Studies: Mr Brain Wo Contrast  Result Date: 06/12/2016 CLINICAL DATA:  Dizziness EXAM: MRI HEAD WITHOUT CONTRAST TECHNIQUE: Multiplanar, multiecho pulse sequences of the brain and surrounding structures were obtained without intravenous contrast. COMPARISON:  None FINDINGS: Brain: The midline structures are normal. There is no focal diffusion restriction to  indicate acute infarct. Single focus of hyperintense T2 weighted signal in the left frontal white matter, within normal limits for age. No intraparenchymal hematoma or chronic microhemorrhage. Brain volume is normal for age without age-advanced or lobar predominant atrophy. The dura is normal and there is no extra-axial collection. Vascular: Major intracranial arterial and venous sinus flow voids are preserved. Skull and upper cervical spine: The visualized skull base, calvarium, upper cervical spine and extracranial soft tissues are normal.  Sinuses/Orbits: No fluid levels or advanced mucosal thickening. No mastoid effusion. Normal orbits. IMPRESSION: Normal MRI of the brain for age. Electronically Signed   By: Ulyses Jarred M.D.   On: 06/12/2016 01:24      Subjective: Patient reports dizziness with sitting up and standing. Improved.  Discharge Exam: Vitals:   06/12/16 0504 06/12/16 1000  BP: (!) 119/52 127/67  Pulse: 67 70  Resp: 18 18  Temp: 98.4 F (36.9 C) 98.2 F (36.8 C)   Vitals:   06/12/16 0000 06/12/16 0320 06/12/16 0504 06/12/16 1000  BP: 120/67 (!) 120/53 (!) 119/52 127/67  Pulse: (!) 59 61 67 70  Resp: 18 13 18 18   Temp:   98.4 F (36.9 C) 98.2 F (36.8 C)  TempSrc:    Oral  SpO2: 97% 95% 93% 94%  Weight:   127.5 kg (281 lb)   Height:   5\' 8"  (1.727 m)     General: Pt is alert, awake, not in acute distress Eyes: nystagmus present Cardiovascular: RRR, S1/S2 +, no rubs, no gallops Respiratory: CTA bilaterally, no wheezing, no rhonchi Abdominal: Soft, NT, ND, bowel sounds + Extremities: no edema, no cyanosis    The results of significant diagnostics from this hospitalization (including imaging, microbiology, ancillary and laboratory) are listed below for reference.     Labs: Basic Metabolic Panel:  Recent Labs Lab 06/11/16 1752  NA 137  K 3.6  CL 103  CO2 24  GLUCOSE 144*  BUN 13  CREATININE 0.76  CALCIUM 8.6*   Liver Function Tests:  Recent Labs Lab 06/11/16 1752  AST 36  ALT 26  ALKPHOS 76  BILITOT 0.7  PROT 7.2  ALBUMIN 4.1   CBC:  Recent Labs Lab 06/11/16 1752  WBC 14.6*  HGB 12.4  HCT 38.1  MCV 87.6  PLT 259     SIGNED:   Cordelia Poche, MD [facility] 06/12/2016, 2:31 PM Pager [phone]  If 7PM-7AM, please contact night-coverage [email or web address] Password [email or web address]

## 2016-06-12 NOTE — Progress Notes (Signed)
Discharge instructions and medications discussed with patient.  Prescriptions given to patient.  All questions answered.  

## 2016-06-12 NOTE — Discharge Instructions (Signed)
Valerie Boone,  You appear to have some inflammation of your vestibular system. This can be caused by a recent infection. You will go home on a steroid taper. Please follow-up with your primary care physician or return if symptoms worsen.

## 2016-06-12 NOTE — Evaluation (Addendum)
Physical Therapy Evaluation Patient Details Name: Valerie Boone MRN: [id number] DOB: 1975/03/07 Today's Date: 06/12/2016   History of Present Illness  41 y.o. female admitted to [facility] for sudden onset of vertigo.  MRI negative for acute events.  Pt with significant PMHx of HA, anxiety, claustrophobia, ACDF, lumbar surgery (per pt report).    Clinical Impression  Pt with significant, non-relenting rotational nystagmus that is continuous.  Diff dx includes some kind of cupulolithiasis in her posterior canal, or more likely a unilateral vestibular neuritis (due to the continuous nature of her symptoms and absolutely no response to the high velocity manuever).   She had meclizine prior to our session(per pt report it has not helped) and seems to be slowly improving on her own (i.e. Today is better than yesterday).  I did ask her to periodically open her eyes and focus on the "1" on the calendar in her room to start to train her gaze to be able to quiet the nystagmus.  She will need further assessment.  I did reinforce, if she goes home before she feels significantly better that her husband has to be with her when she is up on her feet and moving.  PT will re-assess later today and/or tomorrow as time allows if she is still here.  I would recommend discontinuing the Meclazine as it slows the body's ability to compensate and per pt is not really helping her symptoms and consider starting a steroid.  I will speak with MD re: these suggestions.      Follow Up Recommendations Outpatient PT;Other (comment) (vestibular PT)    Equipment Recommendations  Rolling walker with 5" wheels    Recommendations for Other Services   NA    Precautions / Restrictions Precautions Precautions: Fall Precaution Comments: unsteady on her feet.       Mobility  Bed Mobility Overal bed mobility: Modified Independent             General bed mobility comments: using railing for stability  Transfers Overall transfer  level: Needs assistance Equipment used: Rolling walker (2 wheeled) Transfers: Sit to/from Stand Sit to Stand: Min guard         General transfer comment: Min guard assist for balance  Ambulation/Gait Ambulation/Gait assistance: Min guard Ambulation Distance (Feet): 15 Feet Assistive device: Rolling walker (2 wheeled) Gait Pattern/deviations: Step-through pattern;Staggering left;Staggering right Gait velocity: decreased   General Gait Details: Pt with more of a tipping gait pattern, verbal cues to try to keep all 4 points of the RW on the ground.  Verbal cues for slower turning speed.          Balance Overall balance assessment: Needs assistance Sitting-balance support: Feet supported;Bilateral upper extremity supported Sitting balance-Leahy Scale: Fair     Standing balance support: Bilateral upper extremity supported Standing balance-Leahy Scale: Poor             06/12/16 1142  Vestibular Assessment  General Observation Near constant rotational vertigo, nausea, mild posterior HA, MRI negative, no head trauma, recent URI, recent antibiotic use, no glasses, no hearing changes, no tinnitus at rest (she did have some in her right ear after canalith repositioning), no h/o vertigo, h/o migraines, but usually light sensative.  Did have meclazine this AM and IV nausea meds.   Symptom Behavior  Type of Dizziness Spinning  Frequency of Dizziness constant  Duration of Dizziness constant, increases with head movement  Aggravating Factors Activity in general (eyes open)  Relieving Factors Lying supine;Head stationary;Closing eyes  Occulomotor Exam  Occulomotor Alignment (difficult to tell due to constant nystagmus)  Spontaneous Comment (rotational)  Smooth Pursuits Comment (very difficult and effortful)  Saccades (unable to perform)  Vestibulo-Occular Reflex  VOR 1 Head Only (x 1 viewing) can do, very slow speed, more difficult vertically  Auditory  Comments equal, grossly  bil  Other Tests  Hyperventilation no change  Positional Testing  Dix-Hallpike Dix-Hallpike Right;Dix-Hallpike Left  Horizontal Canal Testing Horizontal Canal Right;Horizontal Canal Left  Dix-Hallpike Right  Dix-Hallpike Right Duration felt better than left, but still dizy  Dix-Hallpike Right Symptoms Upbeat, left rotatory nystagmus  Dix-Hallpike Left  Dix-Hallpike Left Duration no change from baseline on left, but right testing position felt better  Dix-Hallpike Left Symptoms Upbeat, left rotatory nystagmus  Horizontal Canal Right  Horizontal Canal Right Duration constant  Horizontal Canal Right Symptoms Other (comment) (still rotational, no change in intensity)  Horizontal Canal Left  Horizontal Canal Left Duration still rotational, no change in intensity  06/12/2016 treated with high velocity canalith repositioning for left ear cupulolithiasis without any significant relief. Pt will need to be re-assessed acutely (if she is still here) and/or on an OP basis.                        Pertinent Vitals/Pain Pain Assessment: Faces Faces Pain Scale: Hurts little more Pain Location: some posterior headache Pain Descriptors / Indicators: Aching Pain Intervention(s): Limited activity within patient's tolerance;Monitored during session;Repositioned    Home Living Family/patient expects to be discharged to:: Private residence Living Arrangements: Spouse/significant other Available Help at Discharge: Family;Available 24 hours/day (husband, mother and father are nearby) Type of Home: House Home Access: Stairs to enter Entrance Stairs-Rails: Right Entrance Stairs-Number of Steps: 4 Home Layout: One level Home Equipment: None      Prior Function Level of Independence: Independent         Comments: works full time as a Occupational psychologist.  This episode happend at work.         Extremity/Trunk Assessment   Upper Extremity Assessment Upper Extremity Assessment: Overall WFL for  tasks assessed    Lower Extremity Assessment Lower Extremity Assessment: Overall WFL for tasks assessed    Cervical / Trunk Assessment Cervical / Trunk Assessment: Other exceptions Cervical / Trunk Exceptions: h/o cervical fusion and lumbar surgery per pt, good ROM at neck and no issues with back reported.   Communication   Communication: No difficulties  Cognition Arousal/Alertness: Awake/alert Behavior During Therapy: WFL for tasks assessed/performed Overall Cognitive Status: Within Functional Limits for tasks assessed                                               Assessment/Plan    PT Assessment Patient needs continued PT services  PT Problem List Decreased balance;Decreased mobility;Other (comment) (decreased activity tolerance)       PT Treatment Interventions DME instruction;Gait training;Stair training;Functional mobility training;Therapeutic activities;Therapeutic exercise;Neuromuscular re-education;Balance training;Patient/family education    PT Goals (Current goals can be found in the Care Plan section)  Acute Rehab PT Goals Patient Stated Goal: to get back to feeling normal PT Goal Formulation: With patient Time For Goal Achievement: 06/26/16 Potential to Achieve Goals: Good    Frequency Min 4X/week    AM-PAC PT "6 Clicks" Daily Activity  Outcome Measure Difficulty turning over in bed (including adjusting bedclothes, sheets  and blankets)?: A Little Difficulty moving from lying on back to sitting on the side of the bed? : A Little Difficulty sitting down on and standing up from a chair with arms (e.g., wheelchair, bedside commode, etc,.)?: Total Help needed moving to and from a bed to chair (including a wheelchair)?: A Little Help needed walking in hospital room?: A Little Help needed climbing 3-5 steps with a railing? : A Little 6 Click Score: 16    End of Session   Activity Tolerance: Other (comment) (limited by constant ) Patient  left: in bed;with call bell/phone within reach Nurse Communication: Other (comment) (infiltrated IV) PT Visit Diagnosis: Unsteadiness on feet (R26.81);Dizziness and giddiness (R42)    Time: 8867-7373 PT Time Calculation (min) (ACUTE ONLY): 48 min   Charges:   PT Evaluation $PT Eval Moderate Complexity: 1 Procedure PT Treatments $Therapeutic Activity: 8-22 mins $Canalith Rep Proc: 8-22 mins   PT G Codes:   PT G-Codes **NOT FOR INPATIENT CLASS** Functional Assessment Tool Used: AM-PAC 6 Clicks Basic Mobility Functional Limitation: Mobility: Walking and moving around Mobility: Walking and Moving Around Current Status (G6815): At least 40 percent but less than 60 percent impaired, limited or restricted Mobility: Walking and Moving Around Goal Status [phone]): At least 20 percent but less than 40 percent impaired, limited or restricted     Yoneko Talerico B. [place], [place], DPT [phone]   06/12/2016, 11:42 AM

## 2016-10-29 ENCOUNTER — Other Ambulatory Visit: Payer: Self-pay | Admitting: Family Medicine

## 2016-10-29 DIAGNOSIS — Z1231 Encounter for screening mammogram for malignant neoplasm of breast: Secondary | ICD-10-CM

## 2016-11-20 ENCOUNTER — Ambulatory Visit (INDEPENDENT_AMBULATORY_CARE_PROVIDER_SITE_OTHER): Payer: 59 | Admitting: Podiatry

## 2016-11-20 ENCOUNTER — Encounter: Payer: Self-pay | Admitting: Podiatry

## 2016-11-20 ENCOUNTER — Ambulatory Visit (INDEPENDENT_AMBULATORY_CARE_PROVIDER_SITE_OTHER): Payer: 59

## 2016-11-20 VITALS — BP 154/89 | HR 63 | Resp 16 | Ht 68.0 in | Wt 290.0 lb

## 2016-11-20 DIAGNOSIS — M722 Plantar fascial fibromatosis: Secondary | ICD-10-CM | POA: Diagnosis not present

## 2016-11-20 MED ORDER — TRIAMCINOLONE ACETONIDE 10 MG/ML IJ SUSP
10.0000 mg | Freq: Once | INTRAMUSCULAR | Status: AC
  Administered 2016-11-20: 10 mg

## 2016-11-20 MED ORDER — DICLOFENAC SODIUM 75 MG PO TBEC: 75.0000 mg | DELAYED_RELEASE_TABLET | Freq: Two times a day (BID) | ORAL | 2 refills | Status: DC

## 2016-11-20 NOTE — Patient Instructions (Signed)

## 2016-11-20 NOTE — Progress Notes (Signed)
   Subjective:    Patient ID: Valerie Boone, female    DOB: [birth year], 41 y.o.   MRN: [id number]  HPI    Review of Systems  All other systems reviewed and are negative.      Objective:   Physical Exam        Assessment & Plan:

## 2016-11-23 NOTE — Progress Notes (Signed)
Subjective:    Patient ID: Valerie Boone, female   DOB: 41 y.o.   MRN: [id number]   HPI patient presents with 6 month history of significant discomfort plantar aspect left heel which is worsened over the last few months. States it's making ambulation difficult and gradually becoming more of an issue. Patient does not smoke and is not as active as she would like to be    Review of Systems  All other systems reviewed and are negative.       Objective:  Physical Exam  Constitutional: She appears well-developed and well-nourished.  Cardiovascular: Intact distal pulses.  Pulmonary/Chest: Effort normal.  Musculoskeletal: Normal range of motion.  Neurological: She is alert.  Skin: Skin is warm.  Nursing note and vitals reviewed.  neurovascular status intact muscle strength adequate range of motion was within normal limits. Patient's found to have exquisite discomfort plantar aspect heel left at the insertional point tendon into the calcaneus with inflammation and fluid around the medial band. Patient does have good digital perfusion and is well oriented     Assessment:   Acute plantar fasciitis left inflammation fluid around the medial band      Plan:    H&P conditions reviewed x-ray reviewed and injected the plantar fascia today 3 mg Kenalog 5 mg Xylocaine and applied fascial brace. Gave instructions for physical therapy and anti-inflammatory therapy of diclofenac 75 mg twice a day and also shoe gear modifications  X-rays indicate that there is spurring of the plantar heel with no indication of stress fracture or arthritis

## 2016-12-09 ENCOUNTER — Encounter: Payer: Self-pay | Admitting: Podiatry

## 2016-12-09 ENCOUNTER — Ambulatory Visit (INDEPENDENT_AMBULATORY_CARE_PROVIDER_SITE_OTHER): Payer: 59 | Admitting: Podiatry

## 2016-12-09 DIAGNOSIS — M722 Plantar fascial fibromatosis: Secondary | ICD-10-CM | POA: Diagnosis not present

## 2016-12-09 MED ORDER — TRIAMCINOLONE ACETONIDE 10 MG/ML IJ SUSP
10.0000 mg | Freq: Once | INTRAMUSCULAR | Status: AC
  Administered 2016-12-09: 10 mg

## 2016-12-09 NOTE — Progress Notes (Signed)
Subjective:    Patient ID: Valerie Boone, female   DOB: 41 y.o.   MRN: [id number]   HPI patient states that my left foot is feeling some better but I still have pain and I do work 10-12 hours a day    ROS      Objective:  Physical Exam patient has moderate   obesity which is complicating factor with chronic pain of the left plantar fashion    Assessment:   Plantar fasciitis with obesity is complicating factor      Plan:    I've recommended orthotics and patient is casted for functional orthotics and will be seen back when returned

## 2016-12-24 ENCOUNTER — Other Ambulatory Visit (HOSPITAL_COMMUNITY): Payer: Self-pay | Admitting: General Surgery

## 2016-12-31 ENCOUNTER — Other Ambulatory Visit: Payer: 59 | Admitting: Orthotics

## 2016-12-31 DIAGNOSIS — M79676 Pain in unspecified toe(s): Secondary | ICD-10-CM

## 2017-01-15 ENCOUNTER — Ambulatory Visit (HOSPITAL_COMMUNITY)
Admission: RE | Admit: 2017-01-15 | Discharge: 2017-01-15 | Disposition: A | Discharge status: Home / Self Care | Payer: 59 | Source: Ambulatory Visit | Attending: General Surgery | Admitting: General Surgery

## 2017-01-15 ENCOUNTER — Other Ambulatory Visit: Payer: Self-pay

## 2017-01-15 DIAGNOSIS — Z01810 Encounter for preprocedural cardiovascular examination: Secondary | ICD-10-CM | POA: Insufficient documentation

## 2017-01-15 DIAGNOSIS — Z01818 Encounter for other preprocedural examination: Secondary | ICD-10-CM | POA: Insufficient documentation

## 2017-01-22 ENCOUNTER — Encounter: Payer: Self-pay | Admitting: Registered"

## 2017-01-22 ENCOUNTER — Encounter: Payer: 59 | Attending: General Surgery | Admitting: Registered"

## 2017-01-22 DIAGNOSIS — Z6841 Body Mass Index (BMI) 40.0 and over, adult: Secondary | ICD-10-CM | POA: Insufficient documentation

## 2017-01-22 DIAGNOSIS — Z713 Dietary counseling and surveillance: Secondary | ICD-10-CM | POA: Diagnosis not present

## 2017-01-22 DIAGNOSIS — E669 Obesity, unspecified: Secondary | ICD-10-CM

## 2017-01-22 NOTE — Progress Notes (Signed)
Pre-Op Assessment Visit:  Pre-Operative Sleeve gastrectomy Surgery  Medical Nutrition Therapy:  Appt start time: 9:15  End time:  10:18  Patient was seen on 01/22/2017 for Pre-Operative Nutrition Assessment. Assessment and letter of approval faxed to [facility] Surgery Bariatric Surgery Program coordinator on 01/22/2017.   Pt expectation of surgery: long-term health, active lifestyle, overall healthier, ride roller coasters, ability to zipline (less than 250 lbs), donate bone marrow (~ 220 lbs)  Pt expectation of Dietitian: guidance, support  Start weight at [facility]: 298.6 BMI: 48.20   Pt states she really wants to go ziplining with husband. Pt states accountability helps her out; tracking. 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 Pt states husband is diabetic. Pt states she has reduced soda intake.   Per insurance, pt needs 6 SWL visits prior to surgery.    24 hr Dietary Recall: First Meal: sometimes skips; Evol egg white, kale,  Snack: Nabisco crackers Second Meal: sandwich, chips, fruit Snack: hummus, pretzel chips or fruit Third Meal: chicken chili or roast and vegetables Snack: none Beverages: sweet tea, coffee, water (sometimes with flavor packs), Ruby Red grapefruit, sugar-free lite lemonade  Encouraged to engage in 150 minutes of moderate physical activity including cardiovascular and weight baring weekly  Handouts given during visit include:  . Pre-Op Goals . Bariatric Surgery Protein Shakes . Vitamin and Mineral Recommendations  During the appointment today the following Pre-Op Goals were reviewed with the patient: . Track your food and beverage: MyFitness Pal or Baritastic App . Make healthy food choices . Begin to limit portion sizes . Limited concentrated sugars and fried foods . Keep fat/sugar in the single digits per serving on          food labels . Practice  CHEWING your food  (aim for 30 chews per bite or until applesauce consistency) . Practice not drinking 15 minutes before, during, and 30 minutes after each meal/snack . Avoid all carbonated beverages  . Avoid/limit caffeinated beverages  . Avoid all sugar-sweetened beverages . Avoid alcohol . Consume 3 meals per day; eat every 3-5 hours . Make a list of non-food related activities . Aim for 64-100 ounces of FLUID daily  . Aim for at least 60-80 grams of PROTEIN daily . Look for a liquid protein source that contain ?15 g protein and ?5 g carbohydrate  (ex: shakes, drinks, shots) . Physical activity is an important part of a healthy lifestyle so keep it moving!  Follow diet recommendations listed below Energy and Macronutrient Recommendations: Calories: 1800 Carbohydrate: 200 Protein: 135 Fat: 50  Demonstrated degree of understanding via:  Teach Back   Teaching Method Utilized:  Visual Auditory Hands on  Barriers to learning/adherence to lifestyle change: work-life balance  Patient to call the [facility] Education Services to enroll in Pre-Op and Post-Op Nutrition Education when surgery date is scheduled.

## 2017-01-27 ENCOUNTER — Telehealth: Payer: Self-pay | Admitting: *Deleted

## 2017-01-27 NOTE — Telephone Encounter (Signed)
Refill request Diclofenac. Dr. Paulla Dolly states pt needs an appt prior to future refills. Return fax denying.

## 2017-01-28 ENCOUNTER — Ambulatory Visit (INDEPENDENT_AMBULATORY_CARE_PROVIDER_SITE_OTHER): Payer: 59 | Admitting: Neurology

## 2017-01-28 ENCOUNTER — Encounter: Payer: Self-pay | Admitting: Neurology

## 2017-01-28 VITALS — BP 110/80 | HR 80 | Ht 67.0 in | Wt 298.0 lb

## 2017-01-28 DIAGNOSIS — G4761 Periodic limb movement disorder: Secondary | ICD-10-CM | POA: Diagnosis not present

## 2017-01-28 DIAGNOSIS — Z6841 Body Mass Index (BMI) 40.0 and over, adult: Secondary | ICD-10-CM | POA: Diagnosis not present

## 2017-01-28 DIAGNOSIS — R0683 Snoring: Secondary | ICD-10-CM | POA: Diagnosis not present

## 2017-01-28 DIAGNOSIS — R51 Headache: Secondary | ICD-10-CM | POA: Diagnosis not present

## 2017-01-28 DIAGNOSIS — R519 Headache, unspecified: Secondary | ICD-10-CM

## 2017-01-28 NOTE — Progress Notes (Signed)
Subjective:    Patient ID: Valerie Boone is a 42 y.o. female.  HPI     Star Age, MD, PhD [facility] [address], [address] [address] [place], [place] [address]  Dear Dr. Redmond Pulling,   I saw your patient, Valerie Boone, upon your kind request in my neurologic clinic today for initial consultation of her sleep disorder, in particular, concern for underlying obstructive sleep apnea. The patient is unaccompanied today. As you know, [name] is a 42 year old right-handed woman with an underlying medical history of hypothyroidism, PCOS, anxiety, migraines, and morbid obesity with a BMI of over 40, who reports snoring and occasional sleep disruption, recent increase in morning headaches. I reviewed your office note from 12/23/2016, which you kindly included. She is being evaluated for bariatric surgery. Her Epworth sleepiness score is 4 out of 24, fatigue score is 15 out of 63. She had a tonsillectomy in 2001. She had neck surgery and back surgery. She is married and lives with her husband, they have 2 dogs, she has no children. 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 She is a nonsmoker and drinks alcohol infrequently, up to twice a month on average and caffeine in the form of coffee, 1-2 servings per day on average. She tries to hydrate well, has a Hx of migraines, and lately some AM HAs, likely triggered by increase in stressors.  For migraine prevention she has tried Topamax in the recent past but had significant side effects including cognitive side effects. She denies telltale symptoms of restless leg syndrome, however, her husband has noted that she moves her feet while asleep. She does not have night to night nocturia, no family history of OSA.  Her Past Medical History Is Significant For: Past Medical History:  Diagnosis Date  . Anxiety   . Claustrophobia   . Headache(784.0)    tension - 3 to 4 times per week  . Hypothyroidism   . Polycystic ovary disease      Her Past Surgical History Is Significant For: Past Surgical History:  Procedure Laterality Date  . ANTERIOR CERVICAL DECOMP/DISCECTOMY FUSION  02/02/2012   Procedure: ANTERIOR CERVICAL DECOMPRESSION/DISCECTOMY FUSION 2 LEVELS;  Surgeon: Charlie Pitter, MD;  Location: [facility];  Service: Neurosurgery;  Laterality: N/A;  Cerivcal five-six, cervical six-seven anterior cervical decompression fusion with allograft and plating  . HYSTEROSCOPY    . TONSILLECTOMY      Her Family History Is Significant For: Family History  Problem Relation Age of Onset  . Hypertension Other   . Stroke Other     Her Social History Is Significant For: Social History   Socioeconomic History  . Marital status: Married    Spouse name: None  . Number of children: None  . Years of education: None  . Highest education level: None  Social Needs  . Financial resource strain: None  . Food insecurity - worry: Never true  . Food insecurity - inability: Never true  . Transportation needs - medical: None  . Transportation needs - non-medical: None  Occupational History  . None  Tobacco Use  . Smoking status: Never Smoker  . Smokeless tobacco: Never Used  Substance and Sexual Activity  . Alcohol use: Yes    Comment: occasional  . Drug use: No  . Sexual activity: None  Other Topics Concern  . None  Social History Narrative  . None    Her Allergies Are:  Allergies  Allergen Reactions  . Ampicillin  Hives    High fevers  . Sulfa Antibiotics Hives    High fever  . Topiramate Other (See Comments)    Memory loss, tingling and loss of feeling in extremities.   :   Her Current Medications Are:  Outpatient Encounter Medications as of 01/28/2017  Medication Sig  . ALPRAZolam (XANAX) 0.5 MG tablet Take 0.5 mg as needed by mouth for anxiety.  . cyclobenzaprine (FLEXERIL) 10 MG tablet Take 10 mg as needed by mouth for muscle spasms.  . diclofenac (VOLTAREN) 75 MG EC tablet Take 1 tablet (75 mg total) 2  (two) times daily by mouth.  . diclofenac sodium (VOLTAREN) 1 % GEL Apply as needed topically.  Marland Kitchen levothyroxine (SYNTHROID, LEVOTHROID) 88 MCG tablet Take 88 mcg by mouth daily before breakfast.  . Multiple Vitamin (MULTIVITAMIN WITH MINERALS) TABS Take 1 tablet by mouth daily.  . [DISCONTINUED] predniSONE (DELTASONE) 20 MG tablet Take 60mg  x 2 days, 40mg  x 2 days, 20mg  x 3 days, 10mg  x 4 days  . [DISCONTINUED] escitalopram (LEXAPRO) 5 MG tablet Take 5 mg by mouth daily.   No facility-administered encounter medications on file as of 01/28/2017.   :  Review of Systems:  Out of a complete 14 point review of systems, all are reviewed and negative with the exception of these symptoms as listed below: Review of Systems  Neurological:       Pt presents today to discuss her sleep. Pt has never had a sleep study. Pt does endorse snoring.  Epworth Sleepiness Scale 0= would never doze 1= slight chance of dozing 2= moderate chance of dozing 3= high chance of dozing  Sitting and reading: 0 Watching TV: 0 Sitting inactive in a public place (ex. Theater or meeting): 0 As a passenger in a car for an hour without a break: 1 Lying down to rest in the afternoon: 3 Sitting and talking to someone: 0 Sitting quietly after lunch (no alcohol): 0 In a car, while stopped in traffic: 0 Total: 4     Objective:  Neurological Exam  Physical Exam Physical Examination:   Vitals:   01/28/17 0905  BP: 110/80  Pulse: 80    General Examination: The patient is a very pleasant 41 y.o. female in no acute distress. She appears well-developed and well-nourished and well groomed.   HEENT: Normocephalic, atraumatic, pupils are equal, round and reactive to light and accommodation. Funduscopic exam is normal with sharp disc margins noted. Extraocular tracking is good without limitation to gaze excursion or nystagmus noted. Normal smooth pursuit is noted. Hearing is grossly intact. Tympanic membranes are clear  bilaterally. Face is symmetric with normal facial animation and normal facial sensation. Speech is clear with no dysarthria noted. There is no hypophonia. There is no lip, neck/head, jaw or voice tremor. Neck is supple with full range of passive and active motion. There are no carotid bruits on auscultation. Oropharynx exam reveals: mild mouth dryness, good dental hygiene and mild mildly smaller airway opening, tonsils absent, uvula small or rudimentary or ? Status post uvulectomy. Mallampati is class I. Tongue protrudes centrally and palate elevates symmetrically. Neck size is 16 inches.    Chest: Clear to auscultation without wheezing, rhonchi or crackles noted.  Heart: S1+S2+0, regular and normal without murmurs, rubs or gallops noted.   Abdomen: Soft, non-tender and non-distended with normal bowel sounds appreciated on auscultation.  Extremities: There is no pitting edema in the distal lower extremities bilaterally. Pedal pulses are intact.  Skin: Warm and dry  without trophic changes noted.  Musculoskeletal: exam reveals no obvious joint deformities, tenderness or joint swelling or erythema.   Neurologically:  Mental status: The patient is awake, alert and oriented in all 4 spheres. Her immediate and remote memory, attention, language skills and fund of knowledge are appropriate. There is no evidence of aphasia, agnosia, apraxia or anomia. Speech is clear with normal prosody and enunciation. Thought process is linear. Mood is normal and affect is normal.  Cranial nerves II - XII are as described above under HEENT exam. In addition: shoulder shrug is normal with equal shoulder height noted. Motor exam: Normal bulk, strength and tone is noted. There is no drift, tremor or rebound. Romberg is negative. Reflexes are 1+ throughout. Fine motor skills and coordination: intact with normal finger taps, normal hand movements, normal rapid alternating patting, normal foot taps and normal foot agility.   Cerebellar testing: No dysmetria or intention tremor on finger to nose testing. Heel to shin is unremarkable bilaterally. There is no truncal or gait ataxia.  Sensory exam: intact to light touch in the upper and lower extremities.  Gait, station and balance: She stands easily. No veering to one side is noted. No leaning to one side is noted. Posture is age-appropriate and stance is narrow based. Gait shows normal stride length and normal pace. No problems turning are noted. Tandem walk is unremarkable.   Assessment and Plan:  In summary, EMMYLOU BIEKER is a very pleasant 42 y.o.-year old female with an underlying medical history of hypothyroidism, PCOS, anxiety, migraines, and morbid obesity with a BMI of over 40, whose history and physical exam are concerning for obstructive sleep apnea (OSA). I had a long chat with the patient about my findings and the diagnosis of OSA, its prognosis and treatment options. We talked about medical treatments, surgical interventions and non-pharmacological approaches. I explained in particular the risks and ramifications of untreated moderate to severe OSA, especially with respect to developing cardiovascular disease down the Road, including congestive heart failure, difficult to treat hypertension, cardiac arrhythmias, or stroke. Even type 2 diabetes has, in part, been linked to untreated OSA. Symptoms of untreated OSA include daytime sleepiness, memory problems, mood irritability and mood disorder such as depression and anxiety, lack of energy, as well as recurrent headaches, especially morning headaches. We talked about trying to maintain a healthy lifestyle in general, as well as the importance of weight control. I encouraged the patient to eat healthy, exercise daily and keep well hydrated, to keep a scheduled bedtime and wake time routine, to not skip any meals and eat healthy snacks in between meals. I advised the patient not to drive when feeling sleepy. I  recommended the following at this time: sleep study with potential positive airway pressure titration. (We will score hypopneas at 4%).   I explained the sleep test procedure to the patient and also outlined possible surgical and non-surgical treatment options of OSA, including the use of a custom-made dental device (which would require a referral to a specialist dentist or oral surgeon), upper airway surgical options, such as pillar implants, radiofrequency surgery, tongue base surgery, and UPPP (which would involve a referral to an ENT surgeon). Rarely, jaw surgery such as mandibular advancement may be considered.  I also explained the CPAP treatment option to the patient, who indicated that she would be willing to try CPAP if the need arises. I explained the importance of being compliant with PAP treatment, not only for insurance purposes but primarily to improve  Her symptoms, and for the patient's long term health benefit, including to reduce Her cardiovascular risks. I answered all her questions today and the patient was in agreement. I would like to see her back after the sleep study is completed and encouraged her to call with any interim questions, concerns, problems or updates.   Thank you very much for allowing me to participate in the care of this nice patient. If I can be of any further assistance to you please do not hesitate to call me at [phone].  Sincerely,   Star Age, MD, PhD

## 2017-01-28 NOTE — Patient Instructions (Signed)

## 2017-02-17 ENCOUNTER — Telehealth: Payer: Self-pay

## 2017-02-17 DIAGNOSIS — R0683 Snoring: Secondary | ICD-10-CM

## 2017-02-17 NOTE — Telephone Encounter (Signed)
HST order placed. 

## 2017-02-17 NOTE — Telephone Encounter (Signed)
[employer] denied in-lab study, Need order for HST

## 2017-02-19 ENCOUNTER — Encounter: Payer: 59 | Attending: General Surgery | Admitting: Registered"

## 2017-02-19 ENCOUNTER — Encounter: Payer: Self-pay | Admitting: Registered"

## 2017-02-19 DIAGNOSIS — Z6841 Body Mass Index (BMI) 40.0 and over, adult: Secondary | ICD-10-CM | POA: Insufficient documentation

## 2017-02-19 DIAGNOSIS — E669 Obesity, unspecified: Secondary | ICD-10-CM

## 2017-02-19 DIAGNOSIS — Z713 Dietary counseling and surveillance: Secondary | ICD-10-CM | POA: Diagnosis not present

## 2017-02-19 NOTE — Patient Instructions (Addendum)
-   Create a schedule of eating every 3-5 hrs daily.   - Continue to aim to not drink 15 minutes before, not while eating, and waiting 30 minutes after eating while at work.   - Continue to aim to chew at least 30 times per bite especially while at work.   - Reduce sweet tea consumption, substitute with flavor packs or MIO drops.

## 2017-02-19 NOTE — Progress Notes (Signed)
Sleeve gastrectomy Appt start time: 8:30 end time: 8:52  Assessment: 1st SWL Appointment.   Start Wt at NDES: 298.8 Wt: 298.7 BMI: 48.21   Pt arrives having maintained weight from previous visit.   Pt states she has a texture thing with shakes, thinks she may prefer clear drinks. Pt states she bought Isopure drinks yesterday but has not tried them yet. Pt states mother-in-law is moving in with family, adjusting to that.  Pt states she does well with not drinking with meals and chewing well when not at work; more challenging at work due to work environment. Pt states nobody takes 15 minute breaks at work and it has been hard for her to do it as well. Pt states she is constantly drinking. Pt reports she enjoys sweet tea on a weekly basis; not daily.    MEDICATIONS: See list   DIETARY INTAKE:  24-hr recall:  First Meal: sometimes skips; Evol egg white, kale,  Snack: Nabisco crackers Second Meal: sandwich, chips, fruit Snack: hummus, pretzel chips or fruit Third Meal: chicken chili or roast and vegetables Snack: none Beverages: sweet tea, coffee, water (sometimes with flavor packs), Ruby Red grapefruit, sugar-free lite lemonade  Usual physical activity: none stated  Diet to Follow: 1800 calories 200 g carbohydrates 135 g protein 50 g fat  Preferred Learning Style:   No preference indicated   Learning Readiness:   Ready  Change in progress     Nutritional Diagnosis:  [place]-3.3 Overweight/obesity related to past poor dietary habits and physical inactivity as evidenced by patient w/ planned sleeve gastrectomy surgery following dietary guidelines for continued weight loss.    Intervention:  Nutrition counseling for upcoming Bariatric Surgery.  Goals:  - Aim for 150 minutes of physical activity including cardio and weight bearing every week - Create a schedule of eating every 3-5 hrs daily.  - Continue to aim to not drink 15 minutes before, not while eating, and waiting 30  minutes after eating while at work.  - Continue to aim to chew at least 30 times per bite especially while at work.  - Reduce sweet tea consumption, substitute with flavor packs or MIO drops.    Teaching Method Utilized:  Visual Auditory Hands on  Handouts given during visit include:  none  Barriers to learning/adherence to lifestyle change: work environment  Demonstrated degree of understanding via:  Teach Back   Monitoring/Evaluation:  Dietary intake, exercise, and body weight in 1 month(s).

## 2017-03-02 ENCOUNTER — Ambulatory Visit (INDEPENDENT_AMBULATORY_CARE_PROVIDER_SITE_OTHER): Payer: 59 | Admitting: Psychiatry

## 2017-03-02 DIAGNOSIS — F509 Eating disorder, unspecified: Secondary | ICD-10-CM | POA: Diagnosis not present

## 2017-03-10 ENCOUNTER — Ambulatory Visit (INDEPENDENT_AMBULATORY_CARE_PROVIDER_SITE_OTHER): Payer: 59 | Admitting: Neurology

## 2017-03-10 DIAGNOSIS — R0683 Snoring: Secondary | ICD-10-CM

## 2017-03-10 DIAGNOSIS — G4710 Hypersomnia, unspecified: Secondary | ICD-10-CM

## 2017-03-16 ENCOUNTER — Other Ambulatory Visit: Payer: Self-pay | Admitting: Neurology

## 2017-03-16 ENCOUNTER — Telehealth: Payer: Self-pay

## 2017-03-16 DIAGNOSIS — Z6841 Body Mass Index (BMI) 40.0 and over, adult: Secondary | ICD-10-CM

## 2017-03-16 DIAGNOSIS — R519 Headache, unspecified: Secondary | ICD-10-CM

## 2017-03-16 DIAGNOSIS — R51 Headache: Secondary | ICD-10-CM

## 2017-03-16 DIAGNOSIS — R0683 Snoring: Secondary | ICD-10-CM

## 2017-03-16 NOTE — Progress Notes (Signed)
Patient referred by Dr. Redmond Pulling in surgery, seen by me on 01/28/17, HST on 03/10/17.   Please call and notify the patient that the recent home sleep test did not show any significant obstructive sleep apnea. Since the patient is a bariatric surgery candidate and a negative home sleep test does not rule out obstructive sleep apnea, an attended sleep study is indicated and justified. Please resubmit request for authorization for attended sleep study. I have placed another order and will copy Robin.   Thanks,  Star Age, MD, PhD [facility])

## 2017-03-16 NOTE — Telephone Encounter (Signed)
I called pt to discuss her sleep study results. No answer, left a message asking her to call me back. 

## 2017-03-16 NOTE — Procedures (Signed)
[facility] [place],  [address] NAME:  Valerie Boone                                                        DOB: December 31, 1975 MEDICAL RECORD NUMBER [id number]                                     DOS: 03/10/17 REFERRING PHYSICIAN: Greer Pickerel, MD STUDY PERFORMED: Home Sleep Test HISTORY: 42 year old woman with a history of hypothyroidism, PCOS, anxiety, migraines, and morbid obesity with a BMI of over 40, who reports snoring and occasional sleep disruption, recent increase in morning headaches. Epworth sleepiness score is 4 out of 24, BMI of 46.6.   STUDY RESULTS:  Total Recording Time: 6 hours, 21 minutes Total Apnea/Hypopnea Index (AHI):  2.1 /h, RDI: 4.8/h Average Oxygen Saturation:     95%, Lowest Oxygen Desaturation: 91%  Total Time Oxygen Saturation Below or at 88%: 0 minutes  Average Heart Rate:     62 bpm (between 51 and  96 bpm) IMPRESSION: Snoring RECOMMENDATION: This home sleep test does not demonstrate any significant obstructive or central sleep disordered breathing. Some snoring was noted. Since the patient is a bariatric surgery candidate and a negative home sleep test does not rule out obstructive sleep apnea, an attended sleep study is indicated and justified. Other causes of the patient's symptoms, including circadian rhythm disturbances, an underlying mood disorder, medication effect and/or an underlying medical problem cannot be ruled out based on this test. Clinical correlation is recommended. The patient should be cautioned not to drive, work at heights, or operate dangerous or heavy equipment when tired or sleepy. Review and reiteration of good sleep hygiene measures should be pursued with any patient. The patient and the referring provider will be notified of the test results. An appointment in sleep clinic can be made as necessary.   I certify that I have reviewed the raw data recording prior to the issuance of this report in  accordance with the standards of the American Academy of Sleep Medicine (AASM).  Star Age, MD, PhD Diplomat, ABPN (Neurology and Sleep)

## 2017-03-16 NOTE — Telephone Encounter (Signed)
-----   Message from Star Age, MD sent at 03/16/2017 11:49 AM EST ----- Patient referred by Dr. Redmond Pulling in surgery, seen by me on 01/28/17, HST on 03/10/17.   Please call and notify the patient that the recent home sleep test did not show any significant obstructive sleep apnea. Since the patient is a bariatric surgery candidate and a negative home sleep test does not rule out obstructive sleep apnea, an attended sleep study is indicated and justified. Please resubmit request for authorization for attended sleep study. I have placed another order and will copy Robin.   Thanks,  Star Age, MD, PhD [facility])

## 2017-03-17 NOTE — Telephone Encounter (Signed)
Made 2nd attempt to call the pt. No answer. LVM for the pt to call back.  

## 2017-03-18 NOTE — Telephone Encounter (Signed)
I called pt again to discuss. No answer, left a message asking her to call me back. This is our third attempt at reaching pt by phone. Will send a letter asking her to call us back.

## 2017-03-18 NOTE — Telephone Encounter (Signed)
Pt returned my call. I advised her that her HST did not show any significant osa but since pt is a bariatric surgical candidate, Dr. Rexene Alberts recommends an in lab sleep study. Our sleep lab will work on ins auth for this study. Pt is agreeable to this plan. Pt verbalized understanding of results. Pt had no questions at this time but was encouraged to call back if questions arise.

## 2017-03-19 ENCOUNTER — Encounter: Payer: 59 | Attending: General Surgery | Admitting: Registered"

## 2017-03-19 ENCOUNTER — Encounter: Payer: Self-pay | Admitting: Registered"

## 2017-03-19 DIAGNOSIS — E669 Obesity, unspecified: Secondary | ICD-10-CM

## 2017-03-19 DIAGNOSIS — Z713 Dietary counseling and surveillance: Secondary | ICD-10-CM | POA: Diagnosis not present

## 2017-03-19 DIAGNOSIS — Z6841 Body Mass Index (BMI) 40.0 and over, adult: Secondary | ICD-10-CM | POA: Insufficient documentation

## 2017-03-19 NOTE — Patient Instructions (Addendum)
-   Continue to create a routine with physical activity on a regular basis.   - Keep up the great work with everything you're doing.

## 2017-03-19 NOTE — Progress Notes (Signed)
Sleeve gastrectomy Appt start time: 8:38 end time: 8:55  Assessment: 2nd SWL Appointment.   Start Wt at NDES: 298.8 Wt: 299.1 BMI: 48.28   Pt arrives having maintained weight from previous visit.   Pt states she is able to make behavioral changes, but work is the challenge. Pt states taking breaks to eat and chew properly is her current struggle. Pt states she knows she will have to tell her job she will have to take eating breaks after she has surgery and has already given them notice of the changes to come. Pt states she does well with not drinking with meals. Pt states she has not had sweet tea in over a month; substituting with lemon juice, stevia, sweet tea flavor packs. Pt states she has increased physical activity, walking with friends during the week. Pt states she wants to go hiking and walk  10-mile trail. Pt states she has plantar fasciitis which limits some walking.   Pt states she has a texture thing with shakes, thinks she may prefer clear drinks. Pt states she bought Isopure drinks yesterday but has not tried them yet. Pt states mother-in-law is moving in with family, adjusting to that.  Pt states she does well with not drinking with meals and chewing well when not at work; more challenging at work due to work environment. Pt states nobody takes 15 minute breaks at work and it has been hard for her to do it as well. Pt states she is constantly drinking.  Per insurance, pt needs 6 SWL visits prior to surgery.  Next visit: protein shake/drink options, 3 meals/day    MEDICATIONS: See list   DIETARY INTAKE:  24-hr recall:  First Meal: sometimes skips; Evol egg white, kale,  Snack: Nabisco crackers Second Meal: sandwich, chips, fruit Snack: hummus, pretzel chips or fruit Third Meal: chicken chili or roast and vegetables Snack: none Beverages: coffee, water (sometimes with flavor packs), Ruby Red grapefruit, sugar-free lite lemonade  Usual physical activity: walking  [address] with friends at least 2 mi, 2x/week; has plantar fasciitis in left foot, walking neighborhood  Diet to Follow: 1800 calories 200 g carbohydrates 135 g protein 50 g fat  Preferred Learning Style:   No preference indicated   Learning Readiness:   Ready  Change in progress     Nutritional Diagnosis:  [place]-3.3 Overweight/obesity related to past poor dietary habits and physical inactivity as evidenced by patient w/ planned sleeve gastrectomy surgery following dietary guidelines for continued weight loss.    Intervention:  Nutrition counseling for upcoming Bariatric Surgery.  Goals:  - Aim for 150 minutes of physical activity including cardio and weight bearing every week - Continue to create a routine with physical activity on a regular basis.  - Keep up the great work with everything you're doing.    Teaching Method Utilized:  Visual Auditory Hands on  Handouts given during visit include:  none  Barriers to learning/adherence to lifestyle change: work environment  Demonstrated degree of understanding via:  Teach Back   Monitoring/Evaluation:  Dietary intake, exercise, and body weight in 1 month(s).

## 2017-03-26 ENCOUNTER — Encounter: Payer: Self-pay | Admitting: Podiatry

## 2017-03-26 ENCOUNTER — Ambulatory Visit (INDEPENDENT_AMBULATORY_CARE_PROVIDER_SITE_OTHER): Payer: 59

## 2017-03-26 ENCOUNTER — Ambulatory Visit (INDEPENDENT_AMBULATORY_CARE_PROVIDER_SITE_OTHER): Payer: 59 | Admitting: Podiatry

## 2017-03-26 DIAGNOSIS — M722 Plantar fascial fibromatosis: Secondary | ICD-10-CM | POA: Diagnosis not present

## 2017-03-26 MED ORDER — TRIAMCINOLONE ACETONIDE 10 MG/ML IJ SUSP
10.0000 mg | Freq: Once | INTRAMUSCULAR | Status: AC
  Administered 2017-03-26: 10 mg

## 2017-03-26 NOTE — Progress Notes (Signed)
Subjective:   Patient ID: Valerie Boone, female   DOB: 42 y.o.   MRN: [id number]   HPI Patient states she is trying to get a lot more active to lose weight and she continues to have pain in the left heel stating he got better for several months and now has gotten bad again   ROS      Objective:  Physical Exam  Neurovascular status intact with exquisite discomfort plantar aspect left heel insertional point tendon calcaneus     Assessment:  Acute plantar fascia is left with patient up to 4 miles a day on walking     Plan:  H&P condition reviewed and injected the plantar fascial left 3 mg Kenalog 5 mg Xylocaine and did go ahead today and scanned for customized orthotics to reduce plantar pressure on the heel.  Encouraged continued walking

## 2017-03-31 ENCOUNTER — Telehealth: Payer: Self-pay | Admitting: *Deleted

## 2017-03-31 MED ORDER — DICLOFENAC SODIUM 75 MG PO TBEC: 75.0000 mg | DELAYED_RELEASE_TABLET | Freq: Two times a day (BID) | ORAL | 0 refills | Status: AC

## 2017-03-31 NOTE — Telephone Encounter (Signed)
Request refill Diclofenac. Dr. Paulla Dolly states refill and pt needs an appt prior to future refills.

## 2017-04-05 ENCOUNTER — Telehealth: Payer: Self-pay

## 2017-04-05 NOTE — Telephone Encounter (Signed)
Dr Guadelupe Sabin patient ?

## 2017-04-05 NOTE — Telephone Encounter (Signed)
[employer] denied in lab study. HST showed no apnea. Do you want to do a peer to peer?

## 2017-04-06 ENCOUNTER — Ambulatory Visit
Admission: RE | Admit: 2017-04-06 | Discharge: 2017-04-06 | Disposition: A | Discharge status: Home / Self Care | Payer: 59 | Source: Ambulatory Visit | Attending: Family Medicine | Admitting: Family Medicine

## 2017-04-06 DIAGNOSIS — Z1231 Encounter for screening mammogram for malignant neoplasm of breast: Secondary | ICD-10-CM

## 2017-04-07 NOTE — Telephone Encounter (Signed)
Yes. We have pt scheduled for HST on 04/28/17. Pt is aware that this will be on the watchpat and no charge as well.

## 2017-04-07 NOTE — Telephone Encounter (Signed)
As discussed we will ask pt to repeat HST with the other unit.

## 2017-04-08 ENCOUNTER — Other Ambulatory Visit: Payer: Self-pay

## 2017-04-08 ENCOUNTER — Emergency Department (HOSPITAL_COMMUNITY)
Admission: EM | Admit: 2017-04-08 | Discharge: 2017-04-08 | Disposition: A | Discharge status: Home / Self Care | Payer: 59 | Attending: Emergency Medicine | Admitting: Emergency Medicine

## 2017-04-08 ENCOUNTER — Encounter (HOSPITAL_COMMUNITY): Payer: Self-pay

## 2017-04-08 DIAGNOSIS — E039 Hypothyroidism, unspecified: Secondary | ICD-10-CM | POA: Diagnosis not present

## 2017-04-08 DIAGNOSIS — W540XXA Bitten by dog, initial encounter: Secondary | ICD-10-CM | POA: Diagnosis not present

## 2017-04-08 DIAGNOSIS — Y929 Unspecified place or not applicable: Secondary | ICD-10-CM | POA: Insufficient documentation

## 2017-04-08 DIAGNOSIS — S01551A Open bite of lip, initial encounter: Secondary | ICD-10-CM | POA: Diagnosis not present

## 2017-04-08 DIAGNOSIS — Y939 Activity, unspecified: Secondary | ICD-10-CM | POA: Diagnosis not present

## 2017-04-08 DIAGNOSIS — Z79899 Other long term (current) drug therapy: Secondary | ICD-10-CM | POA: Diagnosis not present

## 2017-04-08 DIAGNOSIS — Y998 Other external cause status: Secondary | ICD-10-CM | POA: Insufficient documentation

## 2017-04-08 DIAGNOSIS — S0185XA Open bite of other part of head, initial encounter: Secondary | ICD-10-CM

## 2017-04-08 MED ORDER — LIDOCAINE HCL (PF) 1 % IJ SOLN
5.0000 mL | Freq: Once | INTRAMUSCULAR | Status: AC
  Administered 2017-04-08: 5 mL
  Filled 2017-04-08: qty 5

## 2017-04-08 MED ORDER — CIPROFLOXACIN HCL 500 MG PO TABS
500.0000 mg | ORAL_TABLET | Freq: Once | ORAL | Status: AC
  Administered 2017-04-08: 500 mg via ORAL
  Filled 2017-04-08: qty 1

## 2017-04-08 MED ORDER — CLINDAMYCIN HCL 150 MG PO CAPS
450.0000 mg | ORAL_CAPSULE | Freq: Once | ORAL | Status: AC
  Administered 2017-04-08: 450 mg via ORAL
  Filled 2017-04-08: qty 3

## 2017-04-08 MED ORDER — CIPROFLOXACIN HCL 500 MG PO TABS: 500.0000 mg | ORAL_TABLET | Freq: Two times a day (BID) | ORAL | 0 refills | Status: AC

## 2017-04-08 MED ORDER — BACITRACIN ZINC 500 UNIT/GM EX OINT: TOPICAL_OINTMENT | Freq: Two times a day (BID) | CUTANEOUS | Status: DC

## 2017-04-08 MED ORDER — CLINDAMYCIN HCL 150 MG PO CAPS: 450.0000 mg | ORAL_CAPSULE | Freq: Three times a day (TID) | ORAL | 0 refills | Status: AC

## 2017-04-08 NOTE — ED Provider Notes (Signed)
[facility] EMERGENCY DEPARTMENT Provider Note   CSN: [id number] Arrival date & time: 04/08/17  1603     History   Chief Complaint Chief Complaint  Patient presents with  . Animal Bite    HPI Valerie Boone is a 42 y.o. female with the past medical history of anxiety, PCOS, who presents to ED for animal bite to the top lip that occurred 4 hours prior to arrival.  Her own [place] bit her.  Her dog is up-to-date on vaccinations.  She states that her last tetanus was less than 5 years ago.  She did not irrigate the area prior to arrival.  Denies any other complaints at this time.  HPI  Past Medical History:  Diagnosis Date  . Anxiety   . Claustrophobia   . Headache(784.0)    tension - 3 to 4 times per week  . Hypothyroidism   . Polycystic ovary disease     Patient Active Problem List   Diagnosis Date Noted  . 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 Herniation of cervical intervertebral disc with radiculopathy 02/02/2012    Past Surgical History:  Procedure Laterality Date  . ANTERIOR CERVICAL DECOMP/DISCECTOMY FUSION  02/02/2012   Procedure: ANTERIOR CERVICAL DECOMPRESSION/DISCECTOMY FUSION 2 LEVELS;  Surgeon: Charlie Pitter, MD;  Location: [facility];  Service: Neurosurgery;  Laterality: N/A;  Cerivcal five-six, cervical six-seven anterior cervical decompression fusion with allograft and plating  . HYSTEROSCOPY    . TONSILLECTOMY       OB History   None      Home Medications    Prior to Admission medications   Medication Sig Start Date End Date Taking? Authorizing Provider  ALPRAZolam Duanne Moron) 0.5 MG tablet Take 0.5 mg as needed by mouth for anxiety.    [provider]  ciprofloxacin (CIPRO) 500 MG tablet Take 1 tablet (500 mg total) by mouth every 12 (twelve) hours for 7 days. 04/08/17 04/15/17  Peyson Delao, PA-C  clindamycin (CLEOCIN) 150 MG capsule Take 3 capsules (450 mg total) by mouth 3 (three) times daily for 7 days. 04/08/17 04/15/17   Pearla Mckinny, PA-C  CONTRAVE 8-90 MG TB12 Take 1 tablet by mouth 2 (two) times daily. 03/05/17   [provider]  cyclobenzaprine (FLEXERIL) 10 MG tablet Take 10 mg as needed by mouth for muscle spasms.    [provider]  diclofenac (VOLTAREN) 75 MG EC tablet Take 1 tablet (75 mg total) by mouth 2 (two) times daily. 03/31/17   Wallene Huh, DPM  levothyroxine (SYNTHROID, LEVOTHROID) 88 MCG tablet Take 88 mcg by mouth daily before breakfast.    [provider]  Multiple Vitamin (MULTIVITAMIN WITH MINERALS) TABS Take 1 tablet by mouth daily.    [provider]  NUVARING 0.12-0.015 MG/24HR vaginal ring INSERT 1 RING VAGINALLY AS DIRECTED. REMOVE AFTER 3 WEEKS & WAIT 7 DAYS BEFORE INSERTING A NEW RING 02/24/17   [provider]    Family History Family History  Problem Relation Age of Onset  . Hypertension Other   . Stroke Other     Social History Social History   Tobacco Use  . Smoking status: Never Smoker  . Smokeless tobacco: Never Used  Substance Use Topics  . Alcohol use: Yes    Comment: occasional  . Drug use: No     Allergies   Ampicillin; Sulfa antibiotics; and Topiramate   Review of Systems Review of Systems  Constitutional: Negative for chills and fever.  Gastrointestinal: Negative for  nausea and vomiting.  Skin: Positive for wound.  Neurological: Negative for weakness and numbness.     Physical Exam Updated Vital Signs BP (!) 152/84 (BP Location: Right Arm)   Pulse 89   Temp 98.3 F (36.8 C) (Oral)   Resp 16   LMP 03/22/2017   SpO2 100%   Physical Exam  Constitutional: She appears well-developed and well-nourished. No distress.  HENT:  Head: Normocephalic and atraumatic.  Eyes: Conjunctivae and EOM are normal. No scleral icterus.  Neck: Normal range of motion.  Pulmonary/Chest: Effort normal. No respiratory distress.  Neurological: She is alert.  Skin: No rash noted. She is not diaphoretic.  Laceration  of top lip involving the vermilion border.  Psychiatric: She has a normal mood and affect.  Nursing note and vitals reviewed.      ED Treatments / Results  Labs (all labs ordered are listed, but only abnormal results are displayed) Labs Reviewed - No data to display  EKG None  Radiology No results found.  Procedures .Marland KitchenLaceration Repair Date/Time: 04/08/2017 6:59 PM Performed by: Delia Heady, PA-C Authorized by: Delia Heady, PA-C   Consent:    Consent obtained:  Verbal   Consent given by:  Patient   Risks discussed:  Infection, need for additional repair, nerve damage, pain, poor cosmetic result, poor wound healing, retained foreign body, tendon damage and vascular damage Laceration details:    Location:  Lip   Lip location:  Upper exterior lip   Length (cm):  1 Repair type:    Repair type:  Simple Exploration:    Hemostasis achieved with:  Direct pressure Treatment:    Area cleansed with:  Betadine and saline   Amount of cleaning:  Extensive   Irrigation solution:  Sterile saline   Irrigation method:  Syringe and pressure wash Skin repair:    Repair method:  Sutures   Suture size:  6-0   Wound skin closure material used: Ethilon.   Suture technique:  Simple interrupted   Number of sutures:  6 Approximation:    Approximation:  Close   Vermilion border: well-aligned   Post-procedure details:    Dressing:  Antibiotic ointment   Patient tolerance of procedure:  Tolerated well, no immediate complications   (including critical care time)  Medications Ordered in ED Medications  bacitracin ointment (has no administration in time range)  lidocaine (PF) (XYLOCAINE) 1 % injection 5 mL (5 mLs Infiltration Given 04/08/17 1825)  clindamycin (CLEOCIN) capsule 450 mg (450 mg Oral Given 04/08/17 1825)  ciprofloxacin (CIPRO) tablet 500 mg (500 mg Oral Given 04/08/17 1825)     Initial Impression / Assessment and Plan / ED Course  I have reviewed the triage vital signs  and the nursing notes.  Pertinent labs & imaging results that were available during my care of the patient were reviewed by me and considered in my medical decision making (see chart for details).     Patient presents to ED for evaluation of dog bite to her upper lip that occurred approximately 4 hours prior to arrival.  It was her own dog who is up-to-date on vaccinations.  She is up-to-date on her tetanus.  Area was cleansed extensively and closed.  She was given first dose of her Cipro and Clinda here as she is allergic to penicillins.  Patient counseled on wound care. Patient counseled on need to return or see PCP/urgent care for suture removal in 5 days. Patient was urged to return to the Emergency Department urgently with  worsening pain, swelling, expanding erythema especially if it streaks away from the affected area, fever, or if they have any other concerns. Patient verbalized understanding.   Portions of this note were generated with Lobbyist. Dictation errors may occur despite best attempts at proofreading.   Final Clinical Impressions(s) / ED Diagnoses   Final diagnoses:  Dog bite of face, initial encounter    ED Discharge Orders        Ordered    clindamycin (CLEOCIN) 150 MG capsule  3 times daily     04/08/17 1857    ciprofloxacin (CIPRO) 500 MG tablet  Every 12 hours     04/08/17 1857       Delia Heady, PA-C 04/08/17 1901    Gareth Morgan, MD 04/09/17 1348

## 2017-04-08 NOTE — Discharge Instructions (Addendum)
Please read attached information regarding your condition, return precautions and wound care. Apply antibiotic ointment to area as directed. Take antibiotics as directed.  Please complete the entire course of this medication regardless of symptom improvement. Return in 5 days for suture removal.  He can return to this ED, any ED or urgent care or your primary care's office for removal.

## 2017-04-08 NOTE — ED Triage Notes (Signed)
Pt reports chihuahua bit her in top lip at 2:15 today. Pt  Reports dog is UTD on vaccines and TDAP <5 years ago.

## 2017-04-22 ENCOUNTER — Encounter: Payer: 59 | Attending: General Surgery | Admitting: Registered"

## 2017-04-22 ENCOUNTER — Encounter: Payer: Self-pay | Admitting: Registered"

## 2017-04-22 DIAGNOSIS — Z713 Dietary counseling and surveillance: Secondary | ICD-10-CM | POA: Diagnosis present

## 2017-04-22 DIAGNOSIS — E669 Obesity, unspecified: Secondary | ICD-10-CM

## 2017-04-22 DIAGNOSIS — Z6841 Body Mass Index (BMI) 40.0 and over, adult: Secondary | ICD-10-CM | POA: Diagnosis not present

## 2017-04-22 NOTE — Progress Notes (Signed)
Sleeve gastrectomy Appt start time: 8:30 end time: 8:49  Assessment: 3rd SWL Appointment.   Start Wt at [facility]: 298.8 Wt: 302.0 BMI: 48.74   Pt arrives having gained 2.9 lbs from previous visit.   Pt states she uses mobile app [employer] to find nearby trails to increase walking with friends.  Pt states she is irritated with surgery process and wants to have surgery before May because her insurance plan will rollover at that point and she has already met her deductible. Pt states she has tried to contact [facility] several times without receiving communication in return as to answer her questions about having surgery. Pt states she had some PCP visits prior to joining our program and was told that those visits may count towards her required visits and that she will likely be able to have her procedure done before the end of May. Pt states she will try to contact the someone higher up to get her questions answered. Pt states all the preliminary testing will be a waste if she does not have surgery before May. Pt was informed that she will still need to do Pre-op class regardless of when she has surgery. Pt states she understands and is not trying to be mean but wants answers.   Pt states she likes Atkins protein shake milk chocolate flavor. Pt states she is taking a multi.    Pt states she is able to make behavioral changes, but work is the challenge. Pt states taking breaks to eat and chew properly is her current struggle. Pt states she knows she will have to tell her job she will have to take eating breaks after she has surgery and has already given them notice of the changes to come. Pt states she does well with not drinking with meals. Pt states she has not had sweet tea in over a month; substituting with lemon juice, stevia, sweet tea flavor packs. Pt states she has increased physical activity, walking with friends during the week. Pt states she wants to go hiking and walk  10-mile trail. Pt states she  has plantar fasciitis which limits some walking.   Pt states she has a texture thing with shakes, thinks she may prefer clear drinks. Pt states she bought Isopure drinks yesterday but has not tried them yet. Pt states mother-in-law is moving in with family, adjusting to that.  Pt states she does well with not drinking with meals and chewing well when not at work; more challenging at work due to work environment. Pt states nobody takes 15 minute breaks at work and it has been hard for her to do it as well. Pt states she is constantly drinking.  Per insurance, pt needs 6 SWL visits prior to surgery.  Next visit: protein shake/drink options, 3 meals/day    MEDICATIONS: See list   DIETARY INTAKE:  24-hr recall:  First Meal: sometimes skips; Evol egg white, kale or soft boiled eggs  Snack: Nabisco crackers Second Meal: sandwich, chips, fruit Snack: hummus, pretzel chips or fruit Third Meal: chicken chili or roast and vegetables Snack: none Beverages: coffee, water (sometimes with flavor packs), Ruby Red grapefruit, sugar-free lite lemonade  Usual physical activity: walking [place] with friends averaging about 5 mi/week; has plantar fasciitis in left foot  Diet to Follow: 1800 calories 200 g carbohydrates 135 g protein 50 g fat  Preferred Learning Style:   No preference indicated   Learning Readiness:   Ready  Change in progress  Nutritional Diagnosis:  -3.3 Overweight/obesity related to past poor dietary habits and physical inactivity as evidenced by patient w/ planned sleeve gastrectomy surgery following dietary guidelines for continued weight loss.    Intervention:  Nutrition counseling for upcoming Bariatric Surgery.  Goals:  - Aim for 150 minutes of physical activity including cardio and weight bearing every week - Try to find out another protein shake/drink option that you can tolerate.  - Continue to aim for 3 meals/day consistently.  - Keep up the great  work!   Teaching Method Utilized:  Visual Auditory Hands on  Handouts given during visit include:  none  Barriers to learning/adherence to lifestyle change: work environment  Demonstrated degree of understanding via:  Teach Back   Monitoring/Evaluation:  Dietary intake, exercise, and body weight in 1 month(s).

## 2017-04-22 NOTE — Patient Instructions (Addendum)
-   Try to find out another protein shake/drink option that you can tolerate.   - Continue to aim for 3 meals/day consistently.   - Keep up the great work!

## 2017-05-11 ENCOUNTER — Ambulatory Visit (INDEPENDENT_AMBULATORY_CARE_PROVIDER_SITE_OTHER): Payer: 59 | Admitting: Orthotics

## 2017-05-11 DIAGNOSIS — M722 Plantar fascial fibromatosis: Secondary | ICD-10-CM

## 2017-05-11 NOTE — Progress Notes (Signed)
Patient came in today to pick up custom made foot orthotics.  The goals were accomplished and the patient reported no dissatisfaction with said orthotics.  Patient was advised of breakin period and how to report any issues. 

## 2017-05-13 ENCOUNTER — Other Ambulatory Visit: Payer: Self-pay | Admitting: Obstetrics & Gynecology

## 2017-05-21 ENCOUNTER — Ambulatory Visit: Payer: Self-pay | Admitting: Registered"

## 2017-09-05 IMAGING — MR MR HEAD W/O CM
9 of 10 series · 35 of 48 positions shown · non-contrast
Comparison: None

CLINICAL DATA: Dizziness

EXAM:
MRI HEAD WITHOUT CONTRAST
TECHNIQUE: Multiplanar, multiecho pulse sequences of the brain and surrounding
structures were obtained without intravenous contrast.

[Series 3: DWI · axial · 3.0mm · 0.94mm/px · z∈[-97,+50]mm · 8 of 100 slices shown (1 of 2)]
[im 1/100]
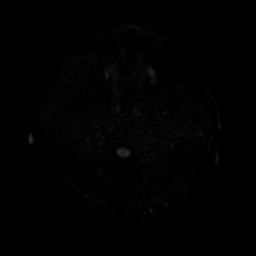
[im 12/100]
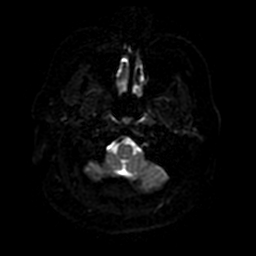
[im 34/100]
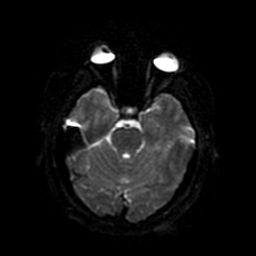
[im 45/100]
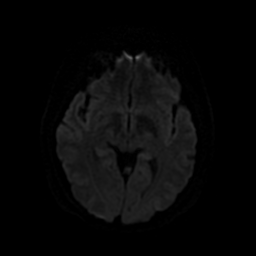
[im 56/100]
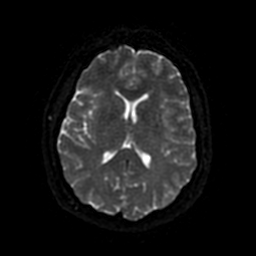
[im 67/100]
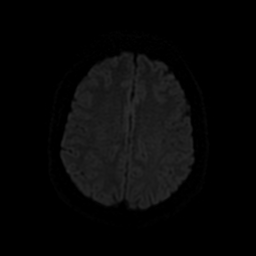
[im 89/100]
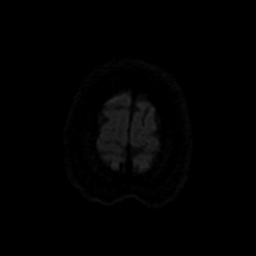
[im 100/100]
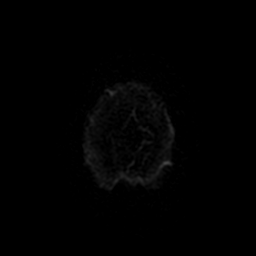

[Series 4: FLAIR · sagittal · 5.0mm · 0.47mm/px · 2 of 23 slices shown (1 of 2)]
[im 1/23]
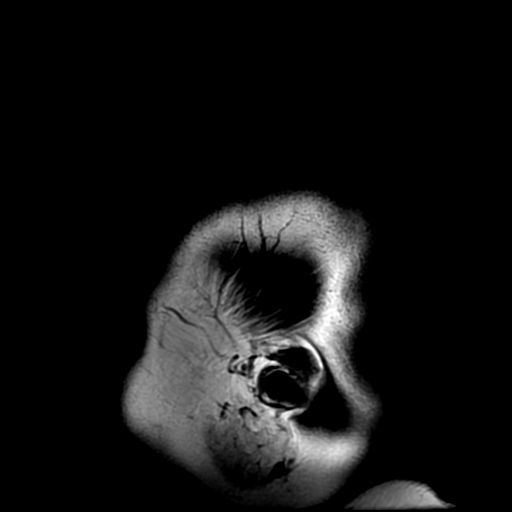
[im 23/23]
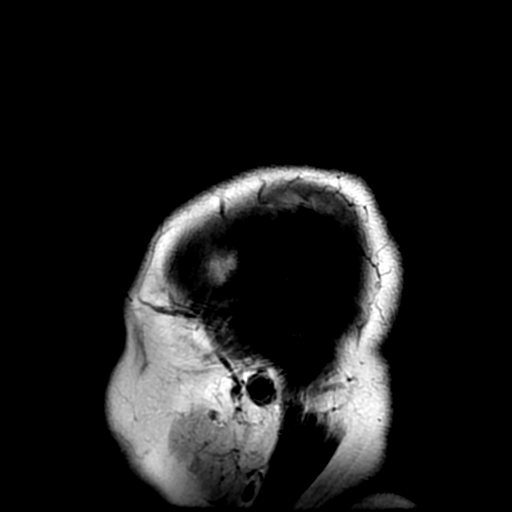

[Series 5: T2 · axial · 5.0mm · 0.43mm/px · z∈[-95,+48]mm · 2 of 25 slices shown (1 of 2)]
[im 1/25]
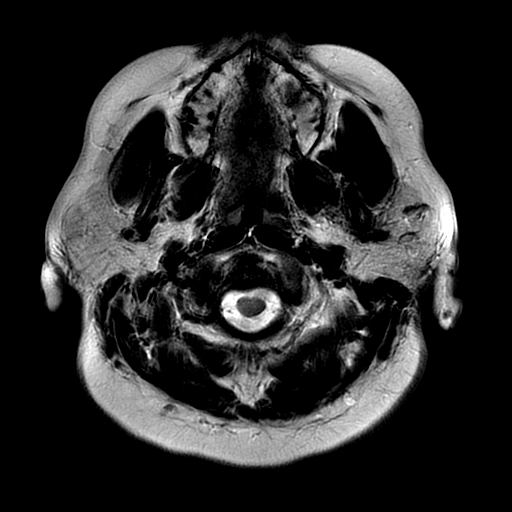
[im 25/25]
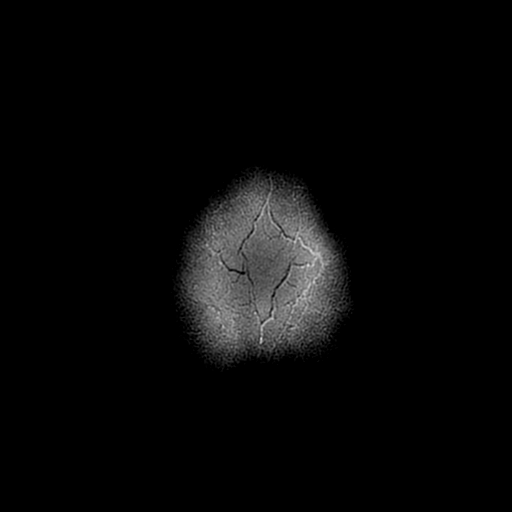

[Series 6: FLAIR · axial · 5.0mm · 0.43mm/px · z∈[-95,+48]mm · 2 of 25 slices shown (2 of 2)]
[im 1/25]
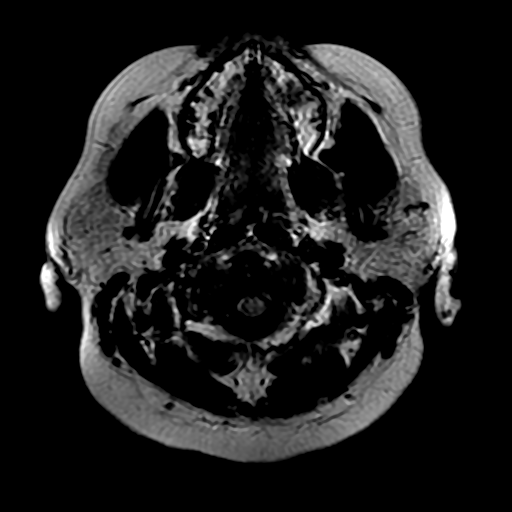
[im 25/25]
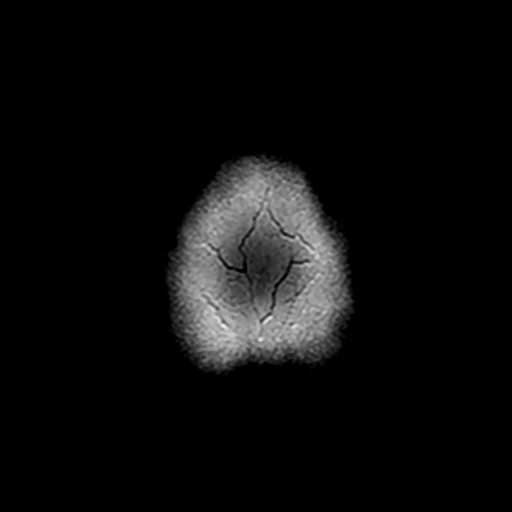

[Series 7: DWI · coronal · 4.0mm · 0.94mm/px · 7 of 72 slices shown (2 of 2)]
[im 1/72]
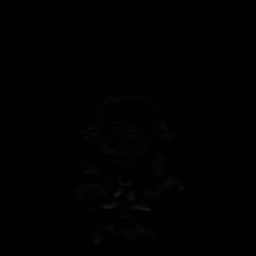
[im 12/72]
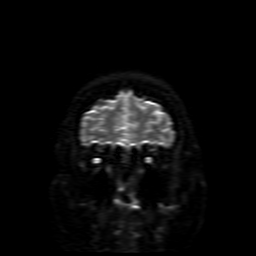
[im 24/72]
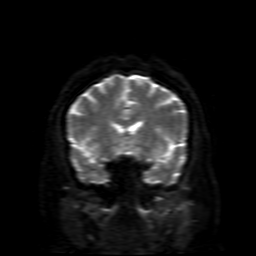
[im 36/72]
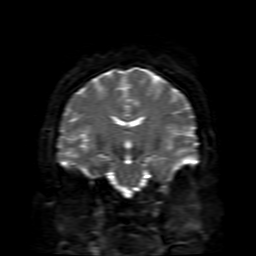
[im 48/72]
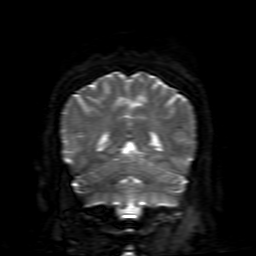
[im 60/72]
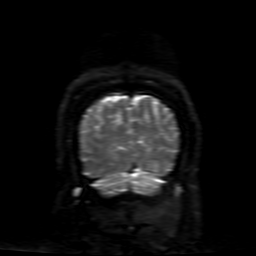
[im 72/72]
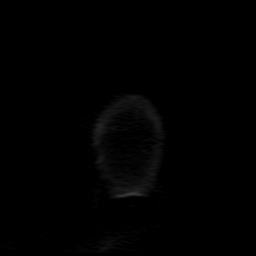

[Series 8: (person_name) · axial · 3.0mm · 0.47mm/px · z∈[-98,-62]mm · 3 of 100 slices shown]
[im 1/100]
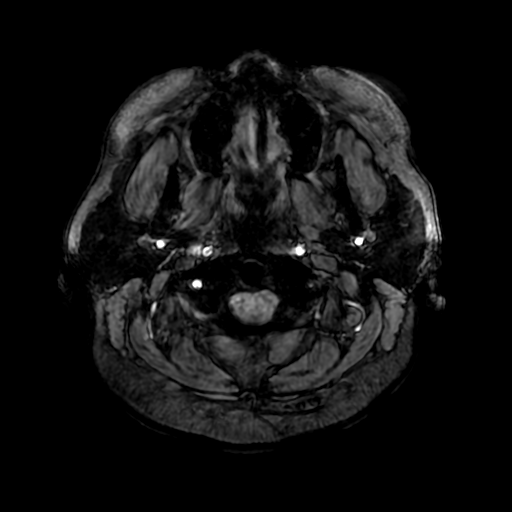
[im 13/100]
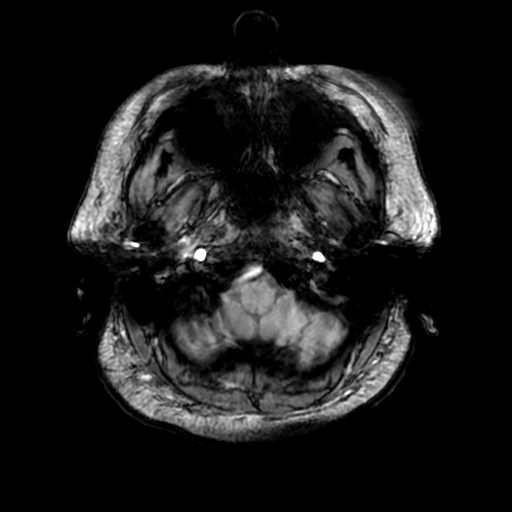
[im 25/100]
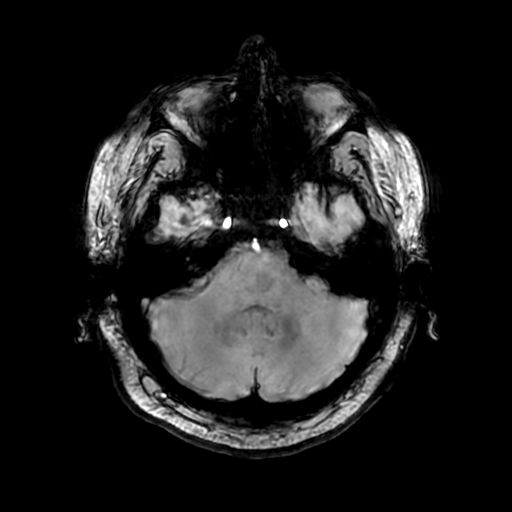

[Series 10: T2 · coronal · 5.0mm · 0.39mm/px · 3 of 30 slices shown (2 of 2)]
[im 1/30]
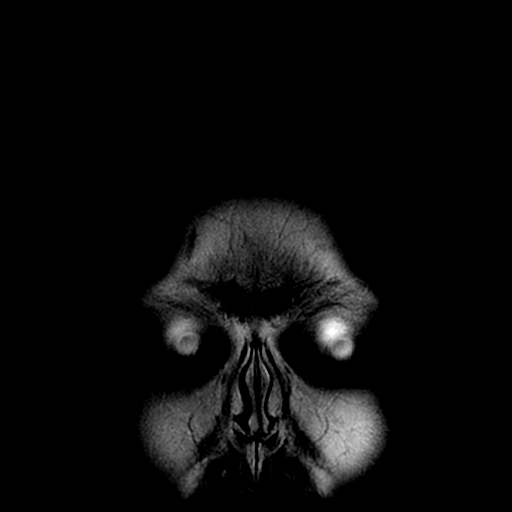
[im 15/30]
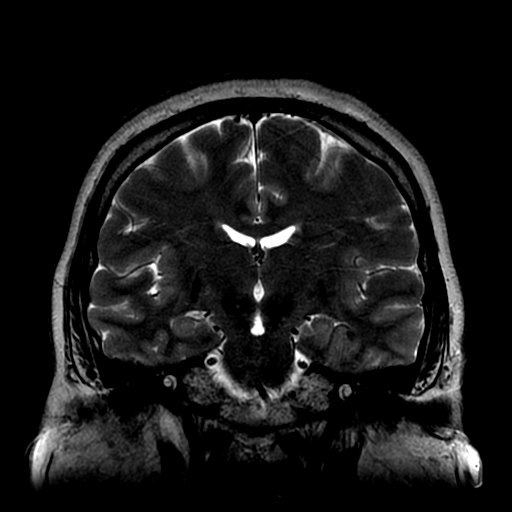
[im 30/30]
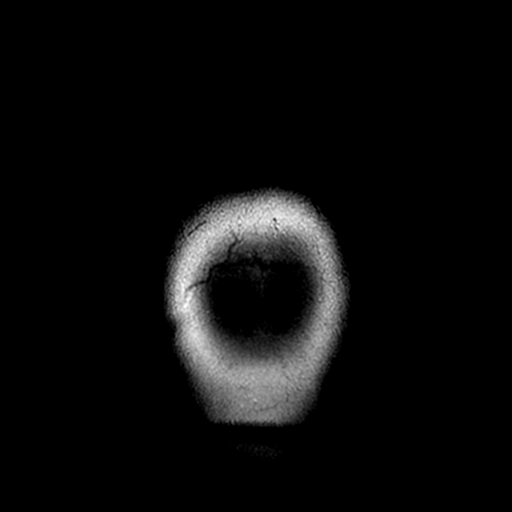

[Series 350: ADC · axial · 3.0mm · 0.94mm/px · z∈[-97,+50]mm · 5 of 50 slices shown (1 of 2)]
[im 1/50]
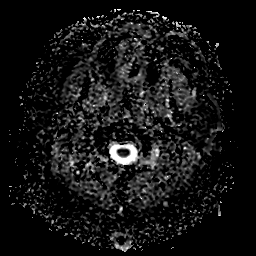
[im 13/50]
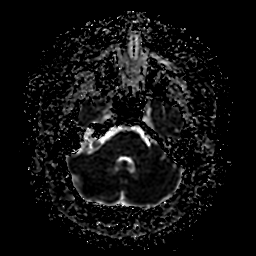
[im 25/50]
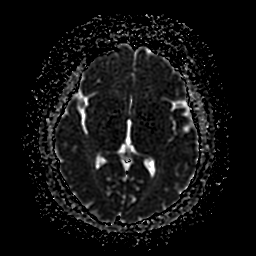
[im 37/50]
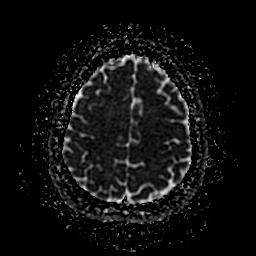
[im 50/50]
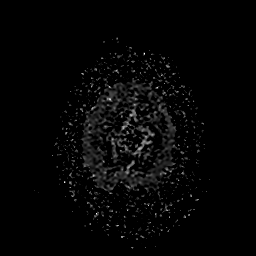

[Series 750: ADC · coronal · 4.0mm · 0.94mm/px · 3 of 36 slices shown (2 of 2)]
[im 1/36]
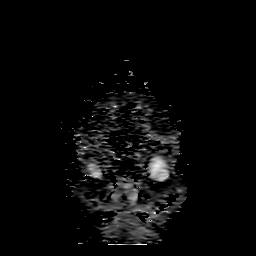
[im 18/36]
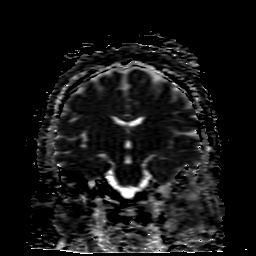
[im 36/36]
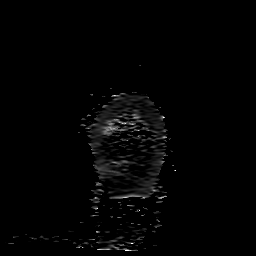

[35 of 48 positions shown; findings below may reference images not displayed]

FINDINGS: Brain: The midline structures are normal. There is no focal
diffusion restriction to indicate acute infarct. Single focus of
hyperintense T2 weighted signal in the left frontal white matter,
within normal limits for age. No intraparenchymal hematoma or
chronic microhemorrhage. Brain volume is normal for age without
age-advanced or lobar predominant atrophy. The dura is normal and
there is no extra-axial collection.

Vascular: Major intracranial arterial and venous sinus flow voids
are preserved.

Skull and upper cervical spine: The visualized skull base,
calvarium, upper cervical spine and extracranial soft tissues are
normal.

Sinuses/Orbits: No fluid levels or advanced mucosal thickening. No
mastoid effusion. Normal orbits.
IMPRESSION: Normal MRI of the brain for age.

## 2018-11-25 ENCOUNTER — Other Ambulatory Visit: Payer: Self-pay | Admitting: Family Medicine

## 2018-11-25 ENCOUNTER — Other Ambulatory Visit: Payer: Self-pay

## 2018-11-25 ENCOUNTER — Ambulatory Visit
Admission: RE | Admit: 2018-11-25 | Discharge: 2018-11-25 | Disposition: A | Discharge status: Home / Self Care | Payer: 59 | Source: Ambulatory Visit | Attending: Family Medicine | Admitting: Family Medicine

## 2018-11-25 DIAGNOSIS — Z1231 Encounter for screening mammogram for malignant neoplasm of breast: Secondary | ICD-10-CM

## 2019-02-24 ENCOUNTER — Other Ambulatory Visit: Payer: Self-pay | Admitting: Obstetrics & Gynecology

## 2020-05-06 ENCOUNTER — Other Ambulatory Visit: Payer: Self-pay | Admitting: Family Medicine

## 2020-05-06 DIAGNOSIS — Z1231 Encounter for screening mammogram for malignant neoplasm of breast: Secondary | ICD-10-CM

## 2020-06-25 ENCOUNTER — Ambulatory Visit: Payer: 59

## 2020-08-16 ENCOUNTER — Ambulatory Visit
Admission: RE | Admit: 2020-08-16 | Discharge: 2020-08-16 | Disposition: A | Discharge status: Home / Self Care | Payer: No Typology Code available for payment source | Source: Ambulatory Visit | Attending: Family Medicine | Admitting: Family Medicine

## 2020-08-16 ENCOUNTER — Other Ambulatory Visit: Payer: Self-pay

## 2020-08-16 ENCOUNTER — Inpatient Hospital Stay: Admission: RE | Admit: 2020-08-16 | Payer: No Typology Code available for payment source | Source: Ambulatory Visit

## 2020-08-16 DIAGNOSIS — Z1231 Encounter for screening mammogram for malignant neoplasm of breast: Secondary | ICD-10-CM

## 2020-08-20 ENCOUNTER — Other Ambulatory Visit: Payer: Self-pay | Admitting: Family Medicine

## 2020-08-20 DIAGNOSIS — R928 Other abnormal and inconclusive findings on diagnostic imaging of breast: Secondary | ICD-10-CM

## 2020-09-04 ENCOUNTER — Ambulatory Visit
Admission: RE | Admit: 2020-09-04 | Discharge: 2020-09-04 | Disposition: A | Discharge status: Home / Self Care | Payer: No Typology Code available for payment source | Source: Ambulatory Visit | Attending: Family Medicine | Admitting: Family Medicine

## 2020-09-04 ENCOUNTER — Other Ambulatory Visit: Payer: Self-pay

## 2020-09-04 DIAGNOSIS — R928 Other abnormal and inconclusive findings on diagnostic imaging of breast: Secondary | ICD-10-CM

## 2021-02-21 ENCOUNTER — Other Ambulatory Visit: Payer: Self-pay | Admitting: Family Medicine

## 2021-02-21 DIAGNOSIS — Z1231 Encounter for screening mammogram for malignant neoplasm of breast: Secondary | ICD-10-CM

## 2021-08-19 ENCOUNTER — Ambulatory Visit
Admission: RE | Admit: 2021-08-19 | Discharge: 2021-08-19 | Disposition: A | Discharge status: Home / Self Care | Payer: No Typology Code available for payment source | Source: Ambulatory Visit | Attending: Family Medicine | Admitting: Family Medicine

## 2021-08-19 DIAGNOSIS — Z1231 Encounter for screening mammogram for malignant neoplasm of breast: Secondary | ICD-10-CM

## 2021-08-21 ENCOUNTER — Other Ambulatory Visit: Payer: Self-pay | Admitting: Family Medicine

## 2021-08-21 DIAGNOSIS — R928 Other abnormal and inconclusive findings on diagnostic imaging of breast: Secondary | ICD-10-CM

## 2021-08-22 ENCOUNTER — Other Ambulatory Visit: Payer: Self-pay | Admitting: Family Medicine

## 2021-08-22 DIAGNOSIS — R928 Other abnormal and inconclusive findings on diagnostic imaging of breast: Secondary | ICD-10-CM

## 2021-09-08 ENCOUNTER — Ambulatory Visit
Admission: RE | Admit: 2021-09-08 | Discharge: 2021-09-08 | Disposition: A | Discharge status: Home / Self Care | Payer: No Typology Code available for payment source | Source: Ambulatory Visit | Attending: Family Medicine | Admitting: Family Medicine

## 2021-09-08 DIAGNOSIS — R928 Other abnormal and inconclusive findings on diagnostic imaging of breast: Secondary | ICD-10-CM

## 2021-10-21 ENCOUNTER — Other Ambulatory Visit: Payer: Self-pay | Admitting: Family Medicine

## 2021-10-21 DIAGNOSIS — Z1231 Encounter for screening mammogram for malignant neoplasm of breast: Secondary | ICD-10-CM

## 2022-08-25 ENCOUNTER — Ambulatory Visit
Admission: RE | Admit: 2022-08-25 | Discharge: 2022-08-25 | Disposition: A | Discharge status: Home / Self Care | Payer: No Typology Code available for payment source | Source: Ambulatory Visit | Attending: Family Medicine | Admitting: Family Medicine

## 2022-08-25 ENCOUNTER — Other Ambulatory Visit (HOSPITAL_COMMUNITY)
Admission: RE | Admit: 2022-08-25 | Discharge: 2022-08-25 | Disposition: A | Discharge status: Home / Self Care | Payer: No Typology Code available for payment source | Source: Ambulatory Visit | Attending: Nurse Practitioner | Admitting: Nurse Practitioner

## 2022-08-25 DIAGNOSIS — Z1231 Encounter for screening mammogram for malignant neoplasm of breast: Secondary | ICD-10-CM

## 2022-08-25 DIAGNOSIS — Z124 Encounter for screening for malignant neoplasm of cervix: Secondary | ICD-10-CM | POA: Insufficient documentation

## 2022-08-27 LAB — CYTOLOGY - PAP
Comment: NEGATIVE
Diagnosis: UNDETERMINED — AB
High risk HPV: NEGATIVE

## 2023-02-02 ENCOUNTER — Other Ambulatory Visit: Payer: Self-pay | Admitting: Family Medicine

## 2023-02-02 DIAGNOSIS — Z1231 Encounter for screening mammogram for malignant neoplasm of breast: Secondary | ICD-10-CM

## 2023-09-10 ENCOUNTER — Ambulatory Visit
Admission: RE | Admit: 2023-09-10 | Discharge: 2023-09-10 | Disposition: A | Discharge status: Home / Self Care | Payer: No Typology Code available for payment source | Source: Ambulatory Visit | Attending: Family Medicine | Admitting: Family Medicine

## 2023-09-10 DIAGNOSIS — Z1231 Encounter for screening mammogram for malignant neoplasm of breast: Secondary | ICD-10-CM
